# Patient Record
Sex: Female | Born: 1965 | Race: White | Hispanic: No | Marital: Married | Smoking: Never smoker
Health system: Southern US, Community
[De-identification: ages and names within clinical notes are randomized; demographics above are authoritative.]

## PROBLEM LIST (undated history)

## (undated) DIAGNOSIS — D219 Benign neoplasm of connective and other soft tissue, unspecified: Secondary | ICD-10-CM

## (undated) DIAGNOSIS — IMO0001 Reserved for inherently not codable concepts without codable children: Secondary | ICD-10-CM

## (undated) DIAGNOSIS — D649 Anemia, unspecified: Secondary | ICD-10-CM

## (undated) DIAGNOSIS — D229 Melanocytic nevi, unspecified: Secondary | ICD-10-CM

## (undated) DIAGNOSIS — Z803 Family history of malignant neoplasm of breast: Secondary | ICD-10-CM

## (undated) DIAGNOSIS — K635 Polyp of colon: Secondary | ICD-10-CM

## (undated) DIAGNOSIS — Z800 Family history of malignant neoplasm of digestive organs: Secondary | ICD-10-CM

## (undated) DIAGNOSIS — R7989 Other specified abnormal findings of blood chemistry: Secondary | ICD-10-CM

## (undated) DIAGNOSIS — N200 Calculus of kidney: Secondary | ICD-10-CM

## (undated) HISTORY — PX: TONSILLECTOMY AND ADENOIDECTOMY: SUR1326

## (undated) HISTORY — DX: Anemia, unspecified: D64.9

## (undated) HISTORY — DX: Calculus of kidney: N20.0

## (undated) HISTORY — DX: Family history of malignant neoplasm of breast: Z80.3

## (undated) HISTORY — DX: Melanocytic nevi, unspecified: D22.9

## (undated) HISTORY — PX: KNEE SURGERY: SHX244

## (undated) HISTORY — DX: Reserved for inherently not codable concepts without codable children: IMO0001

## (undated) HISTORY — DX: Family history of malignant neoplasm of digestive organs: Z80.0

## (undated) HISTORY — DX: Other specified abnormal findings of blood chemistry: R79.89

## (undated) HISTORY — DX: Polyp of colon: K63.5

## (undated) HISTORY — DX: Benign neoplasm of connective and other soft tissue, unspecified: D21.9

---
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
## 2001-07-03 ENCOUNTER — Other Ambulatory Visit: Admission: RE | Admit: 2001-07-03 | Discharge: 2001-07-03 | Payer: Self-pay | Admitting: Obstetrics and Gynecology

## 2002-07-09 ENCOUNTER — Other Ambulatory Visit: Admission: RE | Admit: 2002-07-09 | Discharge: 2002-07-09 | Payer: Self-pay | Admitting: Obstetrics and Gynecology

## 2003-07-05 ENCOUNTER — Inpatient Hospital Stay (HOSPITAL_COMMUNITY): Admission: AD | Admit: 2003-07-05 | Discharge: 2003-07-08 | Payer: Self-pay | Admitting: Obstetrics & Gynecology

## 2003-07-28 ENCOUNTER — Other Ambulatory Visit: Admission: RE | Admit: 2003-07-28 | Discharge: 2003-07-28 | Payer: Self-pay | Admitting: Obstetrics and Gynecology

## 2004-12-03 ENCOUNTER — Other Ambulatory Visit: Admission: RE | Admit: 2004-12-03 | Discharge: 2004-12-03 | Payer: Self-pay | Admitting: Obstetrics and Gynecology

## 2005-10-13 ENCOUNTER — Ambulatory Visit: Payer: Self-pay | Admitting: Internal Medicine

## 2005-10-20 ENCOUNTER — Ambulatory Visit: Payer: Self-pay | Admitting: Internal Medicine

## 2005-10-28 ENCOUNTER — Encounter: Admission: RE | Admit: 2005-10-28 | Discharge: 2005-10-28 | Payer: Self-pay | Admitting: Obstetrics and Gynecology

## 2006-03-21 ENCOUNTER — Other Ambulatory Visit: Admission: RE | Admit: 2006-03-21 | Discharge: 2006-03-21 | Payer: Self-pay | Admitting: Obstetrics and Gynecology

## 2006-03-31 ENCOUNTER — Ambulatory Visit: Payer: Self-pay | Admitting: Internal Medicine

## 2006-05-25 ENCOUNTER — Ambulatory Visit: Payer: Self-pay | Admitting: Internal Medicine

## 2006-08-22 DIAGNOSIS — D229 Melanocytic nevi, unspecified: Secondary | ICD-10-CM

## 2006-08-22 HISTORY — DX: Melanocytic nevi, unspecified: D22.9

## 2006-10-31 ENCOUNTER — Encounter: Admission: RE | Admit: 2006-10-31 | Discharge: 2006-10-31 | Payer: Self-pay | Admitting: Obstetrics and Gynecology

## 2007-04-27 ENCOUNTER — Ambulatory Visit: Payer: Self-pay | Admitting: Internal Medicine

## 2007-10-09 ENCOUNTER — Other Ambulatory Visit: Admission: RE | Admit: 2007-10-09 | Discharge: 2007-10-09 | Payer: Self-pay | Admitting: Obstetrics and Gynecology

## 2007-11-06 ENCOUNTER — Encounter: Admission: RE | Admit: 2007-11-06 | Discharge: 2007-11-06 | Payer: Self-pay | Admitting: Obstetrics and Gynecology

## 2008-06-09 DIAGNOSIS — N200 Calculus of kidney: Secondary | ICD-10-CM

## 2008-06-09 HISTORY — DX: Calculus of kidney: N20.0

## 2008-07-04 ENCOUNTER — Ambulatory Visit: Payer: Self-pay | Admitting: Diagnostic Radiology

## 2008-07-04 ENCOUNTER — Emergency Department (HOSPITAL_BASED_OUTPATIENT_CLINIC_OR_DEPARTMENT_OTHER): Admission: EM | Admit: 2008-07-04 | Discharge: 2008-07-04 | Payer: Self-pay | Admitting: *Deleted

## 2008-07-08 ENCOUNTER — Ambulatory Visit: Payer: Self-pay | Admitting: Internal Medicine

## 2008-07-08 DIAGNOSIS — N209 Urinary calculus, unspecified: Secondary | ICD-10-CM | POA: Insufficient documentation

## 2008-10-30 HISTORY — PX: ENDOMETRIAL BIOPSY: SHX622

## 2008-11-10 ENCOUNTER — Encounter: Admission: RE | Admit: 2008-11-10 | Discharge: 2008-11-10 | Payer: Self-pay | Admitting: Obstetrics and Gynecology

## 2008-11-24 HISTORY — PX: ABLATION: SHX5711

## 2009-11-12 ENCOUNTER — Encounter: Admission: RE | Admit: 2009-11-12 | Discharge: 2009-11-12 | Payer: Self-pay | Admitting: Obstetrics and Gynecology

## 2010-04-11 HISTORY — PX: HEMORRHOID SURGERY: SHX153

## 2010-07-22 LAB — DIFFERENTIAL
Basophils Absolute: 0 10*3/uL (ref 0.0–0.1)
Basophils Relative: 0 % (ref 0–1)
Eosinophils Absolute: 0 10*3/uL (ref 0.0–0.7)
Neutro Abs: 7.2 10*3/uL (ref 1.7–7.7)

## 2010-07-22 LAB — URINALYSIS, ROUTINE W REFLEX MICROSCOPIC
Bilirubin Urine: NEGATIVE
Leukocytes, UA: NEGATIVE
Nitrite: NEGATIVE
Protein, ur: 30 mg/dL — AB
Specific Gravity, Urine: 1.034 — ABNORMAL HIGH (ref 1.005–1.030)

## 2010-07-22 LAB — URINE MICROSCOPIC-ADD ON

## 2010-07-22 LAB — BASIC METABOLIC PANEL
GFR calc Af Amer: >60 mL/min (ref >60)
GFR calc non Af Amer: >60 mL/min (ref >60)

## 2010-07-22 LAB — CBC
RDW: 12.2 % (ref 11.5–15.5)
WBC: 7.4 10*3/uL (ref 4.0–10.5)

## 2010-08-27 NOTE — Op Note (Signed)
NAME:  Vanessa Schneider, Vanessa Schneider                         ACCOUNT NO.:  [id number]   MEDICAL RECORD NO.:  [id number]                   PATIENT TYPE:  INP   LOCATION:  9171                                 FACILITY:  [facility]   PHYSICIAN:  Gerrit Friends. Aldona Bar, M.D.                DATE OF BIRTH:  [birth year]   DATE OF PROCEDURE:  07/05/2003  DATE OF DISCHARGE:                                 OPERATIVE REPORT   PREOPERATIVE DIAGNOSES:  1. Term pregnancy.  2. Active labor.  3. Failure to progress.   POSTOPERATIVE DIAGNOSES:  1. Term pregnancy.  2. Active labor.  3. Failure to progress.  4. Delivery of an 8 pound 5 ounce female infant with Apgars of 9 and 9.   PROCEDURE:  Primary low transverse cesarean section.   SURGEON:  Gerrit Friends. Aldona Bar, M.D.   ANESTHESIA:  Epidural, Burnett Corrente, M.D.   HISTORY:  This 45 year old primigravida was admitted on the early morning of  March 26 in early labor.  She was 2 cm dilated at that time with the vertex  at -2 station, and the vertex did not fit the cervix well.   Around 11:30 a.m. she was 4, 80, and the vertex still at -2 station.  She  underwent amniotomy with production of a good amount of amniotic fluid, and  internal monitoring was employed.  She received, having requested, an  epidural and subsequently required Pitocin augmentation of her labor.  Despite three plus hours of Pitocin, at  approximately 3:30 p.m. her cervix  was still 4-5 cm but was now only 50-40% effaced and the vertex was at best  at -1 station.  There was some molding of the fetal vertex noted.   It was felt that the patient exhibited failure to progress with  dysfunctional labor and, accordingly, after discussion with the patient and  her husband she was taken to the operating room for primary low transverse  cesarean section for delivery.   OPERATIVE NOTE:  Once in the operating room the epidural was augmented.  A  Foley catheter was inserted prior to her arrival in the operating  room.  After the patient was adequately prepped and draped and good anesthetic  levels were documented, cesarean section was begun.  A Pfannenstiel incision  was made and with minimal difficulty dissected down sharply to and through  the fascia in a low transverse fashion.  Subfascial space was created  inferiorly and superiorly, muscle separated in the midline, peritoneum  identified and entered appropriately with care taken to avoid the bowel  superiorly and the bladder inferiorly.  At this time the vesicouterine  peritoneum was incised in a low transverse fashion and pushed off the lower  uterine segment with ease.  Sharp incision into the lower uterine segment  with the Metzenbaum scissors in the transverse fashion was then made and  extended laterally with fingers.  Amniotic fluid that  was noted was clear.  Thereafter, with minimal difficulty a viable female infant was delivered  from the vertex position.  The infant cried spontaneously at once and after  the cord was clamped and cut, the infant was passed off to the awaiting team  headed up by Dr. Alison Murray, and subsequently the infant was taken to the  nursery in good condition.  Apgars were noted to be 9 and 9, and subsequent  weight was found to be 8 pounds 5 ounces.   After cord bloods were collected, the placenta was delivered intact.  The  uterus was then exteriorized and rendered free of any remaining products of  conception.  Good uterine contractility was afforded with slowly-given  intravenous Pitocin and manual stimulation.  The uterine incision was closed  with #1 Vicryl in a running locking fashion, and this was reinforced with  several figure-of-eight #1 Vicryls.  Hemostasis was very adequate.  At this  time the abdomen was lavaged of all free blood and clot and the uterus was  replaced into the abdominal cavity.  Closure of the abdomen was begun at  this point after exploration of the abdomen found it to be free of any   remaining sponges or foreign bodies.  All counts were noted to be correct at  this point in time.   The abdominal peritoneum was closed with 0 Vicryl in a running fashion and  the muscle secured with same.  Assured of good subfascial hemostasis, the  fascia was then reapproximated using 0 Vicryl from angle to midline  bilaterally.  The subcutaneous tissue was rendered hemostatic and staples  were then used to close the skin.  A sterile pressure dressing applied and  the patient at this time was transported to recovery, having tolerated the  procedure well.  Estimated blood loss 500 mL.  All counts correct x2.   In summary, this primigravida presented in early labor and developed a very  dysfunctional labor pattern with failure to progress and was taken to the  operating room for a low transverse cesarean section with delivery of an 8  pound 5 ounce female infant with Apgars of 9 and 9.  At the conclusion of  the procedure both the mother and baby were doing well in their respective  recovery areas.                                               Gerrit Friends. Aldona Bar, M.D.    RMW/MEDQ  D:  07/05/2003  T:  07/07/2003  Job:  [id number]

## 2010-12-01 ENCOUNTER — Other Ambulatory Visit: Payer: Self-pay | Admitting: Obstetrics and Gynecology

## 2010-12-01 DIAGNOSIS — Z1231 Encounter for screening mammogram for malignant neoplasm of breast: Secondary | ICD-10-CM

## 2010-12-09 ENCOUNTER — Ambulatory Visit
Admission: RE | Admit: 2010-12-09 | Discharge: 2010-12-09 | Disposition: A | Discharge status: Home / Self Care | Payer: BC Managed Care – PPO | Source: Ambulatory Visit | Attending: Obstetrics and Gynecology | Admitting: Obstetrics and Gynecology

## 2010-12-09 DIAGNOSIS — Z1231 Encounter for screening mammogram for malignant neoplasm of breast: Secondary | ICD-10-CM

## 2010-12-16 ENCOUNTER — Other Ambulatory Visit: Payer: Self-pay | Admitting: Obstetrics and Gynecology

## 2010-12-16 DIAGNOSIS — R928 Other abnormal and inconclusive findings on diagnostic imaging of breast: Secondary | ICD-10-CM

## 2010-12-17 ENCOUNTER — Ambulatory Visit (INDEPENDENT_AMBULATORY_CARE_PROVIDER_SITE_OTHER): Payer: BC Managed Care – PPO | Admitting: General Surgery

## 2010-12-17 ENCOUNTER — Encounter (INDEPENDENT_AMBULATORY_CARE_PROVIDER_SITE_OTHER): Payer: Self-pay | Admitting: General Surgery

## 2010-12-17 VITALS — BP 106/72 | HR 62 | Temp 96.8°F | Ht 66.0 in | Wt 120.2 lb

## 2010-12-17 DIAGNOSIS — K648 Other hemorrhoids: Secondary | ICD-10-CM

## 2010-12-17 DIAGNOSIS — K644 Residual hemorrhoidal skin tags: Secondary | ICD-10-CM

## 2010-12-17 NOTE — Patient Instructions (Signed)
Please use one fleets enema per rectum the morning of surgery at least two hours prior to leaving the house. These can be purchased over the counter.

## 2010-12-17 NOTE — Progress Notes (Signed)
Chief Complaint  Patient presents with  . Other    Eval of hem tag ref by Dr. Genice Rouge    HPI Vanessa Schneider is a 45 y.o. female.   HPI 45 year old Caucasian female referred by Dr. Drue Novel for evaluation of the hemorrhoidal skin tag.The patient states that she developed this after her child was born in 2005. she feels self-conscious about it. She says it is hard to maintain her hygiene. She denies any pain with defecation. She reports one bowel movement per day. She denies any melena or hematochezia. She denies any incontinence. She drinks 2-3 glasses of water per day. She feels that she gets a fair amount of fiber in her diet. The tag has never been thrombosed. She does occasionally read while sitting on the commode.    Past Medical History  Diagnosis Date  . Nasal congestion   . Family history of breast cancer     mother (50) and cousin (64)  . Family history of colon cancer     Past Surgical History  Procedure Date  . Cesarean section 06/2003  . Tonsilectomy, adenoidectomy, bilateral myringotomy and tubes 1974  . Knee surgery 06/1992 and 08/1992    ACL tore bone and second surgery was to remove scar tissue. Left knee    Family History  Problem Relation Age of Onset  . Cancer Mother 65    breast    Social History History  Substance Use Topics  . Smoking status: Never Smoker   . Smokeless tobacco: Never Used  . Alcohol Use: Yes     2 or 3 on weekends    No Known Allergies  No current outpatient prescriptions on file.    Review of Systems Review of Systems  Constitutional: Negative for fever, chills, activity change and appetite change.  HENT: Positive for congestion. Negative for neck pain.   Eyes: Negative for photophobia and visual disturbance.  Respiratory: Negative for chest tightness, shortness of breath and wheezing.   Cardiovascular: Negative for chest pain.  Gastrointestinal: Negative for abdominal pain and abdominal distention.  Genitourinary: Negative for  dysuria, hematuria and difficulty urinating.  Musculoskeletal: Negative for back pain and arthralgias.  Neurological: Negative for dizziness and seizures.  Hematological: Negative.   Psychiatric/Behavioral: Negative.   All other systems reviewed and are negative.    Blood pressure 106/72, pulse 62, temperature 96.8 F (36 C), temperature source Temporal, height 5\' 6"  (1.676 m), weight 120 lb 3.2 oz (54.522 kg).  Physical Exam Physical Exam  Vitals reviewed. Constitutional: She is oriented to person, place, and time. She appears well-developed and well-nourished.  HENT:  Head: Normocephalic and atraumatic.  Eyes: Conjunctivae are normal. No scleral icterus.  Neck: Normal range of motion. Neck supple.  Cardiovascular: Normal rate, regular rhythm and normal heart sounds.   Pulmonary/Chest: Effort normal and breath sounds normal. No respiratory distress.  Abdominal: Soft. Bowel sounds are normal. She exhibits no distension.  Genitourinary: Rectal exam shows external hemorrhoid and internal hemorrhoid. Rectal exam shows no fissure, no tenderness and anal tone normal.       nonthrombosed redundant Left anterolateral ext hemorrhoid  2 column grade 1 internal hemorrhoids (rt lateral, left posterior) on anoscopy  Musculoskeletal: Normal range of motion.  Lymphadenopathy:    She has no cervical adenopathy.  Neurological: She is alert and oriented to person, place, and time. She exhibits normal muscle tone.  Skin: Skin is warm and dry.  Psychiatric: She has a normal mood and affect. Her behavior is normal.  Judgment and thought content normal.    Data Reviewed  Assessment    Grade 1 Two column internal hemorrhoids Left non-thrombosed external hemorrhoid    Plan    We discussed non-operative & operative management.  With respect to the external hemorrhoid,  I discussed observation vs surgical excision.  With respect to the internal hemorrhoids, we discussed observation vs hemorrhoidal  banding.  We discuss increasing her water & fiber intake. We discussed foods that are high in fiber.  I encouraged her to try to get 25-30 grams of fiber/day.  I also encouraged her not to sit on the commode for a prolonged period of time & not to read while on the commode.  The pt has elected to proceed with exam under anesthesia, excision of the external hemorrhoid, & banding of the internal hemorrhoids.  I discussed the procedure in detail.  The patient was given Agricultural engineer.  We discussed the risks and benefits of surgery including, but not limited to bleeding, infection, blood clot formation, anesthesia risk, urinary retention, hemorrhoid recurrence, injury to the sphincters resulting in incontinence, and the rare possibility of anal canal narrowing.  We discussed the typical postoperative course.  I stressed the importance of not becoming constipated after surgery.  The patient was encouraged to limit pain medication if possible as this increases the likelihood of becoming constipated. The patient was advised to take stool softners & drink 8-10 glasses of non-carbonated, non-alcoholic beverages per day and to eat a high fiber diet.  I also encouraged soaking in a water warm bath for 15 minutes at a time several times a day and after a bowel movement.  The patient was advised to take laxatives such as milk of magnesia or Miralax if no bowel movement three days after surgery.  The patient was advised to expect some blood tinged drainage as well as some blood in their bowel movements.      Atilano Ina, MD   [name] 12/17/2010, 11:22 AM

## 2010-12-21 ENCOUNTER — Ambulatory Visit
Admission: RE | Admit: 2010-12-21 | Discharge: 2010-12-21 | Disposition: A | Discharge status: Home / Self Care | Payer: BC Managed Care – PPO | Source: Ambulatory Visit | Attending: Obstetrics and Gynecology | Admitting: Obstetrics and Gynecology

## 2010-12-21 DIAGNOSIS — R928 Other abnormal and inconclusive findings on diagnostic imaging of breast: Secondary | ICD-10-CM

## 2011-01-14 ENCOUNTER — Encounter (HOSPITAL_COMMUNITY)
Admission: RE | Admit: 2011-01-14 | Discharge: 2011-01-14 | Disposition: A | Discharge status: Home / Self Care | Payer: BC Managed Care – PPO | Source: Ambulatory Visit | Attending: General Surgery | Admitting: General Surgery

## 2011-01-14 LAB — SURGICAL PCR SCREEN
MRSA, PCR: NEGATIVE
Staphylococcus aureus: NEGATIVE

## 2011-01-14 LAB — CBC
MCHC: 34.5 g/dL (ref 30.0–36.0)
MCV: 93.5 fL (ref 78.0–100.0)
RDW: 12.2 % (ref 11.5–15.5)
WBC: 8 10*3/uL (ref 4.0–10.5)

## 2011-01-14 LAB — HCG, SERUM, QUALITATIVE: Preg, Serum: NEGATIVE

## 2011-01-20 ENCOUNTER — Ambulatory Visit (HOSPITAL_COMMUNITY)
Admission: RE | Admit: 2011-01-20 | Discharge: 2011-01-20 | Disposition: A | Discharge status: Home / Self Care | Payer: BC Managed Care – PPO | Source: Ambulatory Visit | Attending: General Surgery | Admitting: General Surgery

## 2011-01-20 DIAGNOSIS — K644 Residual hemorrhoidal skin tags: Secondary | ICD-10-CM

## 2011-01-20 DIAGNOSIS — K648 Other hemorrhoids: Secondary | ICD-10-CM

## 2011-01-20 DIAGNOSIS — Z01812 Encounter for preprocedural laboratory examination: Secondary | ICD-10-CM | POA: Insufficient documentation

## 2011-01-20 NOTE — Op Note (Signed)
Vanessa Schneider, Vanessa Schneider               ACCOUNT NO.:  [id number]  MEDICAL RECORD NO.:  [id number]  LOCATION:  [facility]                         FACILITY:  [facility]  PHYSICIAN:  Mary Sella. Andrey Campanile, MD     DATE OF BIRTH:  [birth year]  DATE OF PROCEDURE:  01/20/2011 DATE OF DISCHARGE:                              OPERATIVE REPORT   PREOPERATIVE DIAGNOSES: 1. Nonthrombosed external hemorrhoids. 2. Internal hemorrhoids.  POSTOPERATIVE DIAGNOSES: 1. Left anterior nonthrombosed external hemorrhoid. 2. Two column grade 1 internal hemorrhoids.  PROCEDURES: 1. Exam under anesthesia 2. Excision of nonthrombosed left anterior external hemorrhoid 3. Banding of grade 1 internal hemorrhoids x2.  SURGEON:  Mary Sella. Andrey Campanile, MD  ASSISTANT:  None.  ANESTHESIA:  General plus 7 mL of 0.5% Marcaine with epi, then we used 20 mL at the conclusion of the procedure.  SPECIMEN LEFT:  Anterior external hemorrhoids, which was discarded.  ESTIMATED BLOOD LOSS:  Minimal.  INDICATIONS FOR PROCEDURE:  The patient is a very pleasant 45 year old female, who has had a long history of hemorrhoidal skin tag.  She says it is very difficult to maintain her hygiene with that and to keep the area clean.  It has never been thrombosed.  On physical exam in the office, we also noted some small grade 1 internal hemorrhoids.  We discussed the risks and benefits of excision and banding including but not limited to, bleeding, infection, injury to surrounding structures, urinary retention, pelvic sepsis, need for additional procedures, blood clot formation, and general anesthesia risks as well as the possibility of urinary retention.  The patient elects to proceed to surgery.  DESCRIPTION OF PROCEDURE:  After obtaining informed consent, the patient was brought to the operating room.  General endotracheal anesthesia was established.  Sequential compression devices were placed.  She was then placed in the prone jack-knife position on  the operating room table with the appropriate padding.  Her buttocks were taped apart in her perineum and anus was prepped with Betadine.  She received antibiotics prior to skin incision.  Surgical time-out was performed.  She had a nonthrombosed left external hemorrhoids in the left anterior position. I infiltrated 0.25% Marcaine with epi underneath this to minimize bleeding.  I then made an elliptical incision and excised it in its entirety.  I used the Bovie electrocautery briefly for hemostasis.  I then finger dilated her anus.  We then performed anoscopy.  There were no signs of fissure or fistula.  She had a grade 1 internal hemorrhoids in the left lateral position and she had a grade 1 internal hemorrhoid in the right posterolateral position.  I placed 2 rubber bands around the base of each of the internal hemorrhoids for a total of 4 bands used.  At this point, I closed the external hemorrhoid excision site with a running 3-0 Vicryl.  The length of the incision was about 1.5 cm. I left a small gap laterally for drainage.  I then injected Exparel in the regional fashion.  We then placed 4 x 4's, ABDs, and mesh underwear. She was then placed supine on the stretcher, extubated, an taken to the recovery in stable condition.  All needle, instruments, and sponge  counts were correct x2.  There are no immediate complications.  The patient tolerated the procedure well.     Mary Sella. Andrey Campanile, MD     EMW/MEDQ  D:  01/20/2011  T:  01/20/2011  Job:  [id number]  cc:   Willow Ora, MD Lionel December, M.D.  Electronically Signed by Gaynelle Adu M.D. on 01/20/2011 12:32:20 PM

## 2011-02-04 ENCOUNTER — Encounter (INDEPENDENT_AMBULATORY_CARE_PROVIDER_SITE_OTHER): Payer: Self-pay | Admitting: General Surgery

## 2011-02-04 ENCOUNTER — Ambulatory Visit (INDEPENDENT_AMBULATORY_CARE_PROVIDER_SITE_OTHER): Payer: BC Managed Care – PPO | Admitting: General Surgery

## 2011-02-04 VITALS — BP 108/70 | HR 73 | Temp 97.9°F | Ht 66.0 in | Wt 119.2 lb

## 2011-02-04 DIAGNOSIS — Z09 Encounter for follow-up examination after completed treatment for conditions other than malignant neoplasm: Secondary | ICD-10-CM

## 2011-02-04 NOTE — Patient Instructions (Signed)
Keep drinking plenty of fluids & eating a high fiber diet

## 2011-02-04 NOTE — Progress Notes (Signed)
Chief complaint: Postop  Procedure: Status post exam under anesthesia, excision of left anterior external hemorrhoid, hemorrhoidal banding x2 on January 20, 2011  History of Present Ilness: BP 108/70  Pulse 73  Temp 97.9 F (36.6 C)  Ht 5\' 6"  (1.676 m)  Wt 119 lb 3.2 oz (54.069 kg)  BMI 19.85 kg/m66  45 year old Caucasian female comes in for her first postoperative appointment. She states that she's been doing great. She states that she did not take any pain medication after surgery. She only took Tylenol. She states that the numbing medication lasted until Sunday. She took stool softeners. She had her first bowel movement 2 days after surgery. She has not had any constipation or pain with defecation. She denies any bleeding. She denies any nausea or vomiting. She denies any fevers or chills. She denies any dysuria  Physical Exam: Well-developed well-nourished Caucasian female in no apparent distress pulmonary-lungs are clear Rectal-no cellulitis, induration or fluctuance. There is a little bit of edema in the left and right anterior hemorrhoidal positions. Digital rectal exam was deferred   Assessment and Plan: Status post exam under anesthesia, excision of left anterior external hemorrhoid, hemorrhoidal banding x2-doing well  I am very pleased with how she did after surgery. I encouraged her to keep drinking plenty of water and to continue on a high fiber diet. I explained that the swelling should go down over the next several weeks.  I will see her in 6 weeks

## 2011-03-30 ENCOUNTER — Ambulatory Visit (INDEPENDENT_AMBULATORY_CARE_PROVIDER_SITE_OTHER): Payer: BC Managed Care – PPO | Admitting: General Surgery

## 2011-03-30 ENCOUNTER — Encounter (INDEPENDENT_AMBULATORY_CARE_PROVIDER_SITE_OTHER): Payer: Self-pay | Admitting: General Surgery

## 2011-03-30 VITALS — BP 112/68 | HR 68 | Temp 97.7°F | Resp 12 | Ht 66.0 in | Wt 120.2 lb

## 2011-03-30 DIAGNOSIS — Z09 Encounter for follow-up examination after completed treatment for conditions other than malignant neoplasm: Secondary | ICD-10-CM

## 2011-03-30 NOTE — Progress Notes (Signed)
Chief complaint: Postop  Procedure: Status post exam under anesthesia, excision of left anterior external hemorrhoid, hemorrhoidal banding x2 on January 20, 2011  History of Present Ilness: BP 112/68  Pulse 68  Temp(Src) 97.7 F (36.5 C) (Temporal)  Resp 12  Ht 5\' 6"  (1.676 m)  Wt 120 lb 3.2 oz (54.522 kg)  BMI 19.45 kg/m77  45 year old Caucasian female comes in for her second postoperative appointment. She states that she's been doing great. She is having daily bowel movements. She doesn't notice anything protruding from her anus after defecating. She has not had any constipation or pain with defecation. She denies any bleeding. She denies any nausea or vomiting. She denies any fevers or chills. She denies any dysuria  Physical Exam: BP 112/68  Pulse 68  Temp(Src) 97.7 F (36.5 C) (Temporal)  Resp 12  Ht 5\' 6"  (1.676 m)  Wt 120 lb 3.2 oz (54.522 kg)  BMI 19.40 kg/m2  Well-developed well-nourished Caucasian female in no apparent distress pulmonary-lungs are clear Rectal-no cellulitis, induration or fluctuance. Good tone. No palpable masses or fluctuance. Incision well healed.    Assessment and Plan: Status post exam under anesthesia, excision of left anterior external hemorrhoid, hemorrhoidal banding x2-doing well  I am very pleased with how she did after surgery. I encouraged her to keep drinking plenty of water and to continue on a high fiber diet.The swelling has resolved.   I will see her on a prn basis  Mary Sella. Andrey Campanile, MD, FACS General, Bariatric, & Minimally Invasive Surgery [facility], [place]

## 2011-03-30 NOTE — Patient Instructions (Signed)

## 2011-11-23 ENCOUNTER — Other Ambulatory Visit: Payer: Self-pay | Admitting: Obstetrics and Gynecology

## 2011-11-23 DIAGNOSIS — Z1231 Encounter for screening mammogram for malignant neoplasm of breast: Secondary | ICD-10-CM

## 2011-12-19 ENCOUNTER — Ambulatory Visit
Admission: RE | Admit: 2011-12-19 | Discharge: 2011-12-19 | Disposition: A | Discharge status: Home / Self Care | Payer: BC Managed Care – PPO | Source: Ambulatory Visit | Attending: Obstetrics and Gynecology | Admitting: Obstetrics and Gynecology

## 2011-12-19 DIAGNOSIS — Z1231 Encounter for screening mammogram for malignant neoplasm of breast: Secondary | ICD-10-CM

## 2012-03-20 ENCOUNTER — Ambulatory Visit (INDEPENDENT_AMBULATORY_CARE_PROVIDER_SITE_OTHER): Payer: BC Managed Care – PPO | Admitting: Internal Medicine

## 2012-03-20 ENCOUNTER — Encounter: Payer: Self-pay | Admitting: Internal Medicine

## 2012-03-20 VITALS — BP 106/72 | HR 77 | Temp 98.0°F | Wt 120.0 lb

## 2012-03-20 DIAGNOSIS — J069 Acute upper respiratory infection, unspecified: Secondary | ICD-10-CM

## 2012-03-20 MED ORDER — AMOXICILLIN 500 MG PO CAPS: 1000.0000 mg | ORAL_CAPSULE | Freq: Two times a day (BID) | ORAL | Status: DC

## 2012-03-20 NOTE — Progress Notes (Signed)
  Subjective:    Patient ID: Vanessa Schneider, female    DOB: September 28, 1965, 46 y.o.   MRN: [id number]  HPI Acute visit, last office visit 3 years ago 4 days history of face and chest congestion, cough with a very small amount of sputum. Taking Mucinex DM which helps. Some hoarseness, worse yesterday.  Past Medical History  Diagnosis Date  . Family history of breast cancer     mother (8) and cousin (59)  . Family history of colon cancer   . Contraception     husband's vasectomy   Past Surgical History  Procedure Date  . Cesarean section 06/2003  . Tonsilectomy, adenoidectomy, bilateral myringotomy and tubes 1974  . Knee surgery 06/1992 and 08/1992    ACL tore bone and second surgery was to remove scar tissue. Left knee  . Hemorrhoid surgery 2012   History   Social History  . Marital Status: Married    Spouse Name: N/A    Number of Children: 1  . Years of Education: N/A   Occupational History  . 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 Smoking status: Never Smoker   . Smokeless tobacco: Never Used  . Alcohol Use: Yes     Comment: 2 or 3 on weekends  . Drug Use: No  . Sexually Active: Not on file   Other Topics Concern  . Not on file   Social History Narrative  . No narrative on file   Family History  Problem Relation Age of Onset  . Breast cancer Mother 27  . Colon cancer      GM  . CAD Neg Hx   . Diabetes Neg Hx   . Mitral valve prolapse Mother      Review of Systems No fever chills, some achiness. No nausea, vomiting, diarrhea. Some anterior chest pain with cough only, denies shortness of breath or wheezing.     Objective:   Physical Exam General -- alert, well-developed, and well-nourished.   Neck --no thyromegaly , no LADs HEENT -- TMs normal, throat w/o redness, face symmetric and not tender to palpation, nose not congested  Lungs -- normal respiratory effort, no intercostal retractions, no accessory muscle use, and normal  breath sounds.   Heart-- normal rate, regular rhythm, no murmur, and no gallop.   Psych-- Cognition and judgment appear intact. Alert and cooperative with normal attention span and concentration.  not anxious appearing and not depressed appearing.        Assessment & Plan:  URI, see instructions

## 2012-03-20 NOTE — Patient Instructions (Addendum)
Rest, fluids , tylenol For cough, take Mucinex DM twice a day as needed  Take the antibiotic as prescribed  (Amoxicillin) if not improving in the next 2 or 3 days Call if no better in few days Call anytime if the symptoms are severe

## 2012-12-07 ENCOUNTER — Ambulatory Visit: Payer: Self-pay | Admitting: Obstetrics and Gynecology

## 2012-12-07 ENCOUNTER — Encounter: Payer: Self-pay | Admitting: Obstetrics and Gynecology

## 2012-12-07 ENCOUNTER — Other Ambulatory Visit: Payer: Self-pay

## 2012-12-07 ENCOUNTER — Ambulatory Visit (INDEPENDENT_AMBULATORY_CARE_PROVIDER_SITE_OTHER): Payer: BC Managed Care – PPO | Admitting: Obstetrics and Gynecology

## 2012-12-07 VITALS — BP 103/65 | HR 78 | Resp 20 | Ht 66.0 in | Wt 119.0 lb

## 2012-12-07 DIAGNOSIS — Z0000 Encounter for general adult medical examination without abnormal findings: Secondary | ICD-10-CM

## 2012-12-07 DIAGNOSIS — Z1231 Encounter for screening mammogram for malignant neoplasm of breast: Secondary | ICD-10-CM

## 2012-12-07 DIAGNOSIS — Z01419 Encounter for gynecological examination (general) (routine) without abnormal findings: Secondary | ICD-10-CM

## 2012-12-07 LAB — LIPID PANEL
Cholesterol: 157 mg/dL (ref 0–200)
LDL Cholesterol: 75 mg/dL (ref 0–99)
Total CHOL/HDL Ratio: 2.3 Ratio
Triglycerides: 68 mg/dL (ref <150)

## 2012-12-07 LAB — POCT URINALYSIS DIPSTICK
Bilirubin, UA: NEGATIVE
Blood, UA: NEGATIVE
Glucose, UA: NEGATIVE
Ketones, UA: NEGATIVE
Nitrite, UA: NEGATIVE
Urobilinogen, UA: NEGATIVE
pH, UA: 7

## 2012-12-07 LAB — HEMOGLOBIN, FINGERSTICK: Hemoglobin, fingerstick: 13.5 g/dL (ref 12.0–16.0)

## 2012-12-07 NOTE — Patient Instructions (Signed)
EXERCISE AND DIET:  We recommended that you start or continue a regular exercise program for good health. Regular exercise means any activity that makes your heart beat faster and makes you sweat.  We recommend exercising at least 30 minutes per day at least 3 days a week, preferably 4 or 5.  We also recommend a diet low in fat and sugar.  Inactivity, poor dietary choices and obesity can cause diabetes, heart attack, stroke, and kidney damage, among others.    ALCOHOL AND SMOKING:  Women should limit their alcohol intake to no more than 7 drinks/beers/glasses of wine (combined, not each!) per week. Moderation of alcohol intake to this level decreases your risk of breast cancer and liver damage. And of course, no recreational drugs are part of a healthy lifestyle.  And absolutely no smoking or even second hand smoke. Most people know smoking can cause heart and lung diseases, but did you know it also contributes to weakening of your bones? Aging of your skin?  Yellowing of your teeth and nails?  CALCIUM AND VITAMIN D:  Adequate intake of calcium and Vitamin D are recommended.  The recommendations for exact amounts of these supplements seem to change often, but generally speaking 600 mg of calcium (either carbonate or citrate) and 800 units of Vitamin D per day seems prudent. Certain women may benefit from higher intake of Vitamin D.  If you are among these women, your doctor will have told you during your visit.    PAP SMEARS:  Pap smears, to check for cervical cancer or precancers,  have traditionally been done yearly, although recent scientific advances have shown that most women can have pap smears less often.  However, every woman still should have a physical exam from her gynecologist every year. It will include a breast check, inspection of the vulva and vagina to check for abnormal growths or skin changes, a visual exam of the cervix, and then an exam to evaluate the size and shape of the uterus and  ovaries.  And after 47 years of age, a rectal exam is indicated to check for rectal cancers. We will also provide age appropriate advice regarding health maintenance, like when you should have certain vaccines, screening for sexually transmitted diseases, bone density testing, colonoscopy, mammograms, etc.   MAMMOGRAMS:  All women over 40 years old should have a yearly mammogram. Many facilities now offer a "3D" mammogram, which may cost around $50 extra out of pocket. If possible,  we recommend you accept the option to have the 3D mammogram performed.  It both reduces the number of women who will be called back for extra views which then turn out to be normal, and it is better than the routine mammogram at detecting truly abnormal areas.       

## 2012-12-07 NOTE — Progress Notes (Signed)
47 y.o.   Married    Caucasian   female   G1P1   here for annual exam.    Patient's last menstrual period was 11/05/2012.          Sexually active: yes  The current method of family planning is vasectomy, oral progesterone-only contraceptive and IUD.    Exercising: Treadmill, Weights 1-2 days a week Last mammogram:  12/19/11 neg Last pap smear:11/30/09 neg History of abnormal pap: no Smoking: no Alcohol: 2-3 glasses of wine a week Last colonoscopy:never Last Bone Density:never   Last tetanus shot: less than 10 years Last cholesterol check: 10/09/07 normal   Hgb:    13.5            Urine:neg   Family History  Problem Relation Age of Onset  . Breast cancer Mother 26  . Mitral valve prolapse Mother   . Dementia Mother   . Colon cancer      GM  . CAD Neg Hx   . Diabetes Neg Hx     Patient Active Problem List   Diagnosis Date Noted  . URINARY CALCULUS 07/08/2008    Past Medical History  Diagnosis Date  . Family history of breast cancer     mother (34) and cousin (49)  . Family history of colon cancer   . Contraception     husband's vasectomy  . Kidney stones 06/2008    spon passed  . Fibroid     Past Surgical History  Procedure Laterality Date  . Cesarean section  06/2003  . Tonsilectomy, adenoidectomy, bilateral myringotomy and tubes  1974  . Knee surgery  06/1992 and 08/1992    ACL tore bone and second surgery was to remove scar tissue. Left knee  . Hemorrhoid surgery  2012  . Ablation  11/24/08    Novasure    Allergies: Review of patient's allergies indicates no known allergies.  Current Outpatient Prescriptions  Medication Sig Dispense Refill  . Acetaminophen (TYLENOL PO) Take by mouth as needed.       No current facility-administered medications for this visit.    ROS: Pertinent items are noted in HPI.  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, broadcasting/film/video   Exam:    BP 103/65  Pulse 78  Resp 20  Ht 5\' 6"  (1.676 m)  Wt 119  lb (53.978 kg)  BMI 19.22 kg/m2  LMP 07/28/2014Ht and wt stable from last yr   Wt Readings from Last 3 Encounters:  12/07/12 119 lb (53.978 kg)  03/20/12 120 lb (54.432 kg)  03/30/11 120 lb 3.2 oz (54.522 kg)     Ht Readings from Last 3 Encounters:  12/07/12 5\' 6"  (1.676 m)  03/30/11 5\' 6"  (1.676 m)  02/04/11 5\' 6"  (1.676 m)    General appearance: alert, cooperative and appears stated age Head: Normocephalic, without obvious abnormality, atraumatic Neck: no adenopathy, supple, symmetrical, trachea midline and thyroid not enlarged, symmetric, no tenderness/mass/nodules Lungs: clear to auscultation bilaterally Breasts: Inspection negative, No nipple retraction or dimpling, No nipple discharge or bleeding, No axillary or supraclavicular adenopathy, Normal to palpation without dominant masses Heart: regular rate and rhythm Abdomen: soft, non-tender; bowel sounds normal; no masses,  no organomegaly Extremities: extremities normal, atraumatic, no cyanosis or edema Skin: Skin color, texture, turgor normal. No rashes or lesions Lymph nodes: Cervical, supraclavicular, and axillary nodes normal. No abnormal inguinal nodes palpated Neurologic: Grossly normal   Pelvic: External genitalia:  no lesions  Urethra:  normal appearing urethra with no masses, tenderness or lesions              Bartholins and Skenes: normal                 Vagina: normal appearing vagina with normal color and discharge, no lesions              Cervix: normal appearance              Pap taken: yes        Bimanual Exam:  Uterus:  uterus is normal size, shape, consistency and nontender                                      Adnexa: normal adnexa in size, nontender and no masses                                      Rectovaginal: Confirms                                      Anus:  normal sphincter tone, no lesions  A: normal gyn exam, vasectomy     Novasure 2010     P:     mammogram pap smear counseled  on breast self exam, mammography screening, adequate intake of calcium and vitamin D, diet and exercise return annually or prn     An After Visit Summary was printed and given to the patient.

## 2012-12-13 LAB — IPS PAP TEST WITH HPV

## 2012-12-21 ENCOUNTER — Ambulatory Visit: Payer: BC Managed Care – PPO

## 2013-01-29 ENCOUNTER — Ambulatory Visit: Payer: BC Managed Care – PPO

## 2013-02-26 ENCOUNTER — Ambulatory Visit
Admission: RE | Admit: 2013-02-26 | Discharge: 2013-02-26 | Disposition: A | Discharge status: Home / Self Care | Payer: BC Managed Care – PPO | Source: Ambulatory Visit

## 2013-02-26 DIAGNOSIS — Z1231 Encounter for screening mammogram for malignant neoplasm of breast: Secondary | ICD-10-CM

## 2013-12-06 ENCOUNTER — Telehealth: Payer: Self-pay | Admitting: Obstetrics and Gynecology

## 2013-12-06 NOTE — Telephone Encounter (Signed)
Confirming pts appt °

## 2013-12-12 ENCOUNTER — Ambulatory Visit (INDEPENDENT_AMBULATORY_CARE_PROVIDER_SITE_OTHER): Payer: BC Managed Care – PPO | Admitting: Obstetrics and Gynecology

## 2013-12-12 ENCOUNTER — Encounter: Payer: Self-pay | Admitting: Obstetrics and Gynecology

## 2013-12-12 VITALS — BP 100/62 | HR 64 | Resp 16 | Ht 66.0 in | Wt 119.0 lb

## 2013-12-12 DIAGNOSIS — N939 Abnormal uterine and vaginal bleeding, unspecified: Secondary | ICD-10-CM

## 2013-12-12 DIAGNOSIS — N898 Other specified noninflammatory disorders of vagina: Secondary | ICD-10-CM

## 2013-12-12 DIAGNOSIS — Z0000 Encounter for general adult medical examination without abnormal findings: Secondary | ICD-10-CM

## 2013-12-12 DIAGNOSIS — R6889 Other general symptoms and signs: Secondary | ICD-10-CM

## 2013-12-12 DIAGNOSIS — Z01419 Encounter for gynecological examination (general) (routine) without abnormal findings: Secondary | ICD-10-CM

## 2013-12-12 LAB — COMPREHENSIVE METABOLIC PANEL
ALBUMIN: 4.7 g/dL (ref 3.5–5.2)
ALT: 18 U/L (ref 0–35)
AST: 17 U/L (ref 0–37)
Alkaline Phosphatase: 44 U/L (ref 39–117)
BUN: 17 mg/dL (ref 6–23)
CALCIUM: 9.5 mg/dL (ref 8.4–10.5)
CO2: 27 meq/L (ref 19–32)
Chloride: 103 mEq/L (ref 96–112)
Creat: 0.71 mg/dL (ref 0.50–1.10)
GLUCOSE: 82 mg/dL (ref 70–99)
POTASSIUM: 4.2 meq/L (ref 3.5–5.3)
SODIUM: 138 meq/L (ref 135–145)
TOTAL PROTEIN: 7 g/dL (ref 6.0–8.3)
Total Bilirubin: 1.1 mg/dL (ref 0.2–1.2)

## 2013-12-12 LAB — POCT URINALYSIS DIPSTICK
Bilirubin, UA: NEGATIVE
Blood, UA: NEGATIVE
Glucose, UA: NEGATIVE
KETONES UA: NEGATIVE
LEUKOCYTES UA: NEGATIVE
Nitrite, UA: NEGATIVE
PROTEIN UA: NEGATIVE
Urobilinogen, UA: NEGATIVE
pH, UA: 5

## 2013-12-12 LAB — HEMOGLOBIN, FINGERSTICK: HEMOGLOBIN, FINGERSTICK: 14 g/dL (ref 12.0–16.0)

## 2013-12-12 LAB — CBC
HCT: 41.7 % (ref 36.0–46.0)
Hemoglobin: 13.9 g/dL (ref 12.0–15.0)
MCH: 31.2 pg (ref 26.0–34.0)
MCHC: 33.3 g/dL (ref 30.0–36.0)
MCV: 93.7 fL (ref 78.0–100.0)
PLATELETS: 205 10*3/uL (ref 150–400)
RBC: 4.45 MIL/uL (ref 3.87–5.11)
RDW: 13.1 % (ref 11.5–15.5)
WBC: 5.7 10*3/uL (ref 4.0–10.5)

## 2013-12-12 LAB — LIPID PANEL
Cholesterol: 165 mg/dL (ref 0–200)
HDL: 70 mg/dL (ref >39)
LDL Cholesterol: 82 mg/dL (ref 0–99)
Total CHOL/HDL Ratio: 2.4 Ratio
Triglycerides: 67 mg/dL (ref <150)
VLDL: 13 mg/dL (ref 0–40)

## 2013-12-12 LAB — TSH: TSH: 2.549 u[IU]/mL (ref 0.350–4.500)

## 2013-12-12 NOTE — Progress Notes (Signed)
Patient ID: Vanessa Schneider, female   DOB: 01/14/1966, 48 y.o.   MRN: [id number] GYNECOLOGY VISIT  PCP:   Kathlene November, MD  Referring provider:   HPI: 48 y.o.   Married  Caucasian  female   G1P1 with LMP 11/06/2011. here for  AEX. Had menses once every 6 months after endometrial ablation.  No bleeding for 2 years.  No hot flashes or night sweats.  Feels cold all the time.   Hgb:    14.0 Urine:  Neg  GYNECOLOGIC HISTORY: Patient's last menstrual period was 11/05/2012. Sexually active:  yes Partner preference: female Contraception:  Vasectomy/novasure   Menopausal hormone therapy:n/a DES exposure: no   Blood transfusions: no   Sexually transmitted diseases:  no  GYN procedures and prior surgeries: Novasure ablation 2010, C-section 06/2003, Endometrial biopsy--benign 2010.  Last mammogram: 02-26-13 dense breasts/wnl:[facility].                Last pap and high risk HPV testing:  12-07-12 wnl:neg HR HPV  History of abnormal pap smear:  no   OB History   Grav Para Term Preterm Abortions TAB SAB Ect Mult Living   1 1        1        LIFESTYLE: Exercise:   Walking on treadmill/and some weights             OTHER HEALTH MAINTENANCE: Tetanus/TDap:  2009 HPV:                 n/a Influenza:          never   Bone density:    n/a Colonoscopy:    never  Cholesterol check: 12-07-12  T:157, HDL:68, LDL:75, Trig:68  Family History  Problem Relation Age of Onset  . Breast cancer Mother 15  . Mitral valve prolapse Mother   . Dementia Mother   . Colon cancer      GM  . CAD Neg Hx   . Diabetes Neg Hx   . Colon cancer Paternal Grandmother     Patient Active Problem List   Diagnosis Date Noted  . URINARY CALCULUS 07/08/2008   Past Medical History  Diagnosis Date  . Family history of breast cancer     mother (38) and cousin (48)  . Family history of colon cancer   . Contraception     husband's vasectomy  . Kidney stones 06/2008    spon passed  . Fibroid     Past  Surgical History  Procedure Laterality Date  . Cesarean section  06/2003  . Tonsilectomy, adenoidectomy, bilateral myringotomy and tubes  1974  . Knee surgery  06/1992 and 08/1992    ACL tore bone and second surgery was to remove scar tissue. Left knee  . Hemorrhoid surgery  2012  . Ablation  11/24/08    Novasure  . Endometrial biopsy  10/30/08    benign    ALLERGIES: Review of patient's allergies indicates no known allergies.  Current Outpatient Prescriptions  Medication Sig Dispense Refill  . ibuprofen (ADVIL,MOTRIN) 200 MG tablet Take 200 mg by mouth every 6 (six) hours as needed.       No current facility-administered medications for this visit.     ROS:  Pertinent items are noted in HPI.  History   Social History  . Marital Status: Married    Spouse Name: N/A    Number of Children: 1  . Years of Education: N/A   Occupational History  .  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 Smoking status: Never Smoker   . Smokeless tobacco: Never Used  . Alcohol Use: 1.0 - 1.5 oz/week    2-3 drink(s) per week     Comment: 2-3 glasses of wine a week  . Drug Use: No  . Sexual Activity: Yes    Partners: Male    Birth Control/ Protection: Other-see comments     Comment: vasectomy/novasure ablation 11/2008   Other Topics Concern  . Not on file   Social History Narrative  . No narrative on file    PHYSICAL EXAMINATION:    BP 100/62  Pulse 64  Resp 16  Ht 5\' 6"  (1.676 m)  Wt 119 lb (53.978 kg)  BMI 19.22 kg/m2  LMP 11/05/2012   Wt Readings from Last 3 Encounters:  12/12/13 119 lb (53.978 kg)  12/07/12 119 lb (53.978 kg)  03/20/12 120 lb (54.432 kg)     Ht Readings from Last 3 Encounters:  12/12/13 5\' 6"  (1.676 m)  12/07/12 5\' 6"  (1.676 m)  03/30/11 5\' 6"  (1.676 m)    General appearance: alert, cooperative and appears stated age Head: Normocephalic, without obvious abnormality, atraumatic Neck: no adenopathy, supple, symmetrical, trachea  midline and thyroid not enlarged, symmetric, no tenderness/mass/nodules Lungs: clear to auscultation bilaterally Breasts: Inspection negative, No nipple retraction or dimpling, No nipple discharge or bleeding, No axillary or supraclavicular adenopathy, Normal to palpation without dominant masses Heart: regular rate and rhythm Abdomen: soft, non-tender; no masses,  no organomegaly Extremities: extremities normal, atraumatic, no cyanosis or edema Skin: Skin color, texture, turgor normal. No rashes or lesions Lymph nodes: Cervical, supraclavicular, and axillary nodes normal. No abnormal inguinal nodes palpated Neurologic: Grossly normal  Pelvic: External genitalia:  no lesions              Urethra:  normal appearing urethra with no masses, tenderness or lesions              Bartholins and Skenes: normal                 Vagina: normal appearing vagina with normal color and discharge, no lesions              Cervix: normal appearance, 5 mm white, almost ulcerative area near Nabothian cyst at 3 o'clock.  Small amount of dark brown blood noted.              Pap and high risk HPV testing done: Yes.          Bimanual Exam:  Uterus:  uterus is normal size, shape, consistency and nontender                                      Adnexa: normal adnexa in size, nontender and no masses                                      Rectovaginal:  Yes.                                        Confirms above.  Anus:  normal sphincter tone, no lesions  ASSESSMENT  Normal gynecologic exam. Possible vaginal ulcer.  Status post endometrial ablation.  No menses for 2 years.  PLAN  Mammogram recommended yearly starting at age 48. Pap smear and high risk HPV testing as above. Counseled on self breast exam, Calcium and vitamin D intake, exercise. See lab orders: Yes.  Routine labs plus FSH, estradiol. Return annually or prn   An After Visit Summary was printed and given to the  patient.

## 2013-12-12 NOTE — Patient Instructions (Signed)

## 2013-12-13 LAB — FOLLICLE STIMULATING HORMONE: FSH: 10.6 m[IU]/mL

## 2013-12-13 LAB — ESTRADIOL: ESTRADIOL: 107.3 pg/mL

## 2013-12-18 LAB — IPS PAP TEST WITH HPV

## 2014-02-10 ENCOUNTER — Encounter: Payer: Self-pay | Admitting: Obstetrics and Gynecology

## 2014-02-12 ENCOUNTER — Other Ambulatory Visit: Payer: Self-pay

## 2014-02-12 DIAGNOSIS — Z1231 Encounter for screening mammogram for malignant neoplasm of breast: Secondary | ICD-10-CM

## 2014-03-11 ENCOUNTER — Ambulatory Visit
Admission: RE | Admit: 2014-03-11 | Discharge: 2014-03-11 | Disposition: A | Discharge status: Home / Self Care | Payer: BC Managed Care – PPO | Source: Ambulatory Visit

## 2014-03-11 DIAGNOSIS — Z1231 Encounter for screening mammogram for malignant neoplasm of breast: Secondary | ICD-10-CM

## 2014-03-13 ENCOUNTER — Other Ambulatory Visit: Payer: Self-pay | Admitting: Obstetrics and Gynecology

## 2014-03-13 DIAGNOSIS — R928 Other abnormal and inconclusive findings on diagnostic imaging of breast: Secondary | ICD-10-CM

## 2014-03-24 ENCOUNTER — Ambulatory Visit
Admission: RE | Admit: 2014-03-24 | Discharge: 2014-03-24 | Disposition: A | Discharge status: Home / Self Care | Payer: BC Managed Care – PPO | Source: Ambulatory Visit | Attending: Obstetrics and Gynecology | Admitting: Obstetrics and Gynecology

## 2014-03-24 DIAGNOSIS — R928 Other abnormal and inconclusive findings on diagnostic imaging of breast: Secondary | ICD-10-CM

## 2014-12-22 ENCOUNTER — Ambulatory Visit: Payer: BC Managed Care – PPO | Admitting: Obstetrics and Gynecology

## 2014-12-22 ENCOUNTER — Encounter: Payer: Self-pay | Admitting: Obstetrics and Gynecology

## 2015-01-08 ENCOUNTER — Ambulatory Visit (INDEPENDENT_AMBULATORY_CARE_PROVIDER_SITE_OTHER): Payer: BLUE CROSS/BLUE SHIELD | Admitting: Obstetrics and Gynecology

## 2015-01-08 ENCOUNTER — Encounter: Payer: Self-pay | Admitting: Obstetrics and Gynecology

## 2015-01-08 VITALS — BP 110/74 | HR 70 | Resp 14 | Ht 66.0 in | Wt 122.4 lb

## 2015-01-08 DIAGNOSIS — Z0000 Encounter for general adult medical examination without abnormal findings: Secondary | ICD-10-CM | POA: Diagnosis not present

## 2015-01-08 DIAGNOSIS — Z01419 Encounter for gynecological examination (general) (routine) without abnormal findings: Secondary | ICD-10-CM | POA: Diagnosis not present

## 2015-01-08 LAB — CBC
HCT: 43.5 % (ref 36.0–46.0)
HEMOGLOBIN: 14.7 g/dL (ref 12.0–15.0)
MCH: 31.9 pg (ref 26.0–34.0)
MCHC: 33.8 g/dL (ref 30.0–36.0)
MCV: 94.4 fL (ref 78.0–100.0)
MPV: 10 fL (ref 8.6–12.4)
Platelets: 215 10*3/uL (ref 150–400)
RBC: 4.61 MIL/uL (ref 3.87–5.11)
RDW: 12.7 % (ref 11.5–15.5)
WBC: 7.5 10*3/uL (ref 4.0–10.5)

## 2015-01-08 LAB — LIPID PANEL
CHOL/HDL RATIO: 2.4 ratio (ref <=5.0)
Cholesterol: 178 mg/dL (ref 125–200)
HDL: 75 mg/dL (ref >=46)
LDL CALC: 89 mg/dL (ref <130)
Triglycerides: 70 mg/dL (ref <150)
VLDL: 14 mg/dL (ref <30)

## 2015-01-08 LAB — COMPREHENSIVE METABOLIC PANEL
ALT: 19 U/L (ref 6–29)
AST: 18 U/L (ref 10–35)
Albumin: 5 g/dL (ref 3.6–5.1)
Alkaline Phosphatase: 46 U/L (ref 33–115)
BUN: 15 mg/dL (ref 7–25)
CO2: 24 mmol/L (ref 20–31)
Calcium: 10 mg/dL (ref 8.6–10.2)
Chloride: 102 mmol/L (ref 98–110)
Creat: 0.74 mg/dL (ref 0.50–1.10)
Glucose, Bld: 85 mg/dL (ref 65–99)
Potassium: 3.9 mmol/L (ref 3.5–5.3)
Sodium: 139 mmol/L (ref 135–146)
TOTAL PROTEIN: 7.7 g/dL (ref 6.1–8.1)
Total Bilirubin: 1.1 mg/dL (ref 0.2–1.2)

## 2015-01-08 LAB — POCT URINALYSIS DIPSTICK
Bilirubin, UA: NEGATIVE
Blood, UA: NEGATIVE
GLUCOSE UA: NEGATIVE
KETONES UA: NEGATIVE
LEUKOCYTES UA: NEGATIVE
Nitrite, UA: NEGATIVE
Protein, UA: NEGATIVE
Urobilinogen, UA: NEGATIVE
pH, UA: 5

## 2015-01-08 LAB — TSH: TSH: 2.582 u[IU]/mL (ref 0.350–4.500)

## 2015-01-08 LAB — HEMOGLOBIN, FINGERSTICK: Hemoglobin, fingerstick: 14.9 g/dL (ref 12.0–16.0)

## 2015-01-08 NOTE — Patient Instructions (Signed)

## 2015-01-08 NOTE — Progress Notes (Signed)
Patient ID: Vanessa Schneider, female   DOB: 05-26-1965, 49 y.o.   MRN: [id number] 49 y.o. G1P1 Married Caucasian female here for annual exam.    Stress at work.  Feeling a shaking in the back of her throat.  Would like to see a Social worker.   FSH 10.6 last year.  No hot flashes or night sweats.  No bleeding.   Mother has dementia.  PCP:  Kathlene November, MD   No LMP recorded. Patient has had an ablation.          Sexually active: Yes.  female partner  The current method of family planning is vasectomy/novasure.    Exercising: No.   Smoker:  no  Health Maintenance: Pap:  12-12-13 Neg:Neg HR HPV History of abnormal Pap:  no MMG:  03-11-14 Density Cat.C/possible Right breast mass,Left negative;Ultrasound revealed benign Right breast cyst--Rec. 1 year screening:[facility]. Colonoscopy:  n/a BMD:   n/a  Result  n/a TDaP:  2009 Screening Labs:  Hb today: 14.9, Urine today: Neg   reports that she has never smoked. She has never used smokeless tobacco. She reports that she drinks about 1.2 - 1.8 oz of alcohol per week. She reports that she does not use illicit drugs.  Past Medical History  Diagnosis Date  . Family history of breast cancer     mother (58) and cousin (39)  . Family history of colon cancer   . Contraception     husband's vasectomy  . Kidney stones 06/2008    spon passed  . Fibroid     Past Surgical History  Procedure Laterality Date  . Cesarean section  06/2003  . Tonsilectomy, adenoidectomy, bilateral myringotomy and tubes  1974  . Knee surgery  06/1992 and 08/1992    ACL tore bone and second surgery was to remove scar tissue. Left knee  . Hemorrhoid surgery  2012  . Ablation  11/24/08    Novasure  . Endometrial biopsy  10/30/08    benign    Current Outpatient Prescriptions  Medication Sig Dispense Refill  . ibuprofen (ADVIL,MOTRIN) 200 MG tablet Take 200 mg by mouth every 6 (six) hours as needed.     No current facility-administered medications for this visit.     Family History  Problem Relation Age of Onset  . Breast cancer Mother 50  . Mitral valve prolapse Mother   . Dementia Mother   . Colon cancer      GM  . CAD Neg Hx   . Diabetes Neg Hx   . Colon cancer Paternal Grandmother     ROS:  Pertinent items are noted in HPI.  Otherwise, a comprehensive ROS was negative.  Exam:   BP 110/74 mmHg  Pulse 70  Resp 14  Ht 5\' 6"  (1.676 m)  Wt 122 lb 6.4 oz (55.52 kg)  BMI 19.77 kg/m2    General appearance: alert, cooperative and appears stated age Head: Normocephalic, without obvious abnormality, atraumatic Neck: no adenopathy, supple, symmetrical, trachea midline and thyroid normal to inspection and palpation Lungs: clear to auscultation bilaterally Breasts: normal appearance, no masses or tenderness, Inspection negative, No nipple retraction or dimpling, No nipple discharge or bleeding, No axillary or supraclavicular adenopathy Heart: regular rate and rhythm Abdomen: soft, non-tender; bowel sounds normal; no masses,  no organomegaly Extremities: extremities normal, atraumatic, no cyanosis or edema Skin: Skin color, texture, turgor normal. No rashes or lesions Lymph nodes: Cervical, supraclavicular, and axillary nodes normal. No abnormal inguinal nodes palpated Neurologic: Grossly  normal  Pelvic: External genitalia:  no lesions              Urethra:  normal appearing urethra with no masses, tenderness or lesions              Bartholins and Skenes: normal                 Vagina: normal appearing vagina with normal color and discharge, no lesions              Cervix: 4 mm flat white area at 6:00 on cervix.              Pap taken: Yes.   Bimanual Exam:  Uterus:  normal size, contour, position, consistency, mobility, non-tender              Adnexa: normal adnexa and no mass, fullness, tenderness              Rectovaginal: Yes.  .  Confirms.              Anus:  normal sphincter tone, no lesions  Chaperone was present for  exam.  Assessment:   Well woman visit with normal exam. Status post endometrial ablation.  Nonspecific area on the cervix.  FH of breast cancer.  FH of dementia.  Situational stress.  Plan: Yearly mammogram recommended after age 2. Patient will schedule at [facility]. Recommended self breast exam.  Pap and HR HPV as above. Discussed Calcium, Vitamin D, regular exercise program including cardiovascular and weight bearing exercise. Labs performed.  Yes.  .   See orders.  Routine labs. Refills given on medications.  No..    Discussed increased exercise for stress relief.  Also gave name of counselor, Marya Amsler, for stress reduction and life coaching.  Follow up annually and prn.     After visit summary provided.

## 2015-01-12 LAB — IPS PAP TEST WITH HPV

## 2015-04-12 DIAGNOSIS — R7989 Other specified abnormal findings of blood chemistry: Secondary | ICD-10-CM

## 2015-04-12 HISTORY — DX: Other specified abnormal findings of blood chemistry: R79.89

## 2015-04-17 ENCOUNTER — Ambulatory Visit: Payer: Self-pay | Admitting: Obstetrics and Gynecology

## 2015-04-22 ENCOUNTER — Other Ambulatory Visit: Payer: Self-pay

## 2015-04-22 DIAGNOSIS — Z1231 Encounter for screening mammogram for malignant neoplasm of breast: Secondary | ICD-10-CM

## 2015-05-05 ENCOUNTER — Ambulatory Visit
Admission: RE | Admit: 2015-05-05 | Discharge: 2015-05-05 | Disposition: A | Discharge status: Home / Self Care | Payer: BLUE CROSS/BLUE SHIELD | Source: Ambulatory Visit

## 2015-05-05 DIAGNOSIS — Z1231 Encounter for screening mammogram for malignant neoplasm of breast: Secondary | ICD-10-CM

## 2016-01-20 ENCOUNTER — Encounter: Payer: Self-pay | Admitting: Obstetrics and Gynecology

## 2016-01-20 ENCOUNTER — Ambulatory Visit: Payer: BLUE CROSS/BLUE SHIELD | Admitting: Obstetrics and Gynecology

## 2016-01-20 VITALS — BP 100/60 | HR 76 | Resp 14 | Ht 66.5 in | Wt 123.0 lb

## 2016-01-20 DIAGNOSIS — Z0000 Encounter for general adult medical examination without abnormal findings: Secondary | ICD-10-CM

## 2016-01-20 DIAGNOSIS — Z01411 Encounter for gynecological examination (general) (routine) with abnormal findings: Secondary | ICD-10-CM | POA: Diagnosis not present

## 2016-01-20 DIAGNOSIS — N951 Menopausal and female climacteric states: Secondary | ICD-10-CM

## 2016-01-20 LAB — COMPREHENSIVE METABOLIC PANEL
ALBUMIN: 5.1 g/dL (ref 3.6–5.1)
ALT: 31 U/L — ABNORMAL HIGH (ref 6–29)
AST: 25 U/L (ref 10–35)
Alkaline Phosphatase: 50 U/L (ref 33–130)
BUN: 17 mg/dL (ref 7–25)
CHLORIDE: 101 mmol/L (ref 98–110)
CO2: 28 mmol/L (ref 20–31)
Calcium: 10.1 mg/dL (ref 8.6–10.4)
Creat: 1.33 mg/dL — ABNORMAL HIGH (ref 0.50–1.05)
Glucose, Bld: 64 mg/dL — ABNORMAL LOW (ref 65–99)
POTASSIUM: 4.1 mmol/L (ref 3.5–5.3)
Sodium: 143 mmol/L (ref 135–146)
TOTAL PROTEIN: 7.6 g/dL (ref 6.1–8.1)
Total Bilirubin: 0.8 mg/dL (ref 0.2–1.2)

## 2016-01-20 LAB — CBC
HEMATOCRIT: 43 % (ref 35.0–45.0)
HEMOGLOBIN: 14.3 g/dL (ref 11.7–15.5)
MCH: 31.7 pg (ref 27.0–33.0)
MCHC: 33.3 g/dL (ref 32.0–36.0)
MCV: 95.3 fL (ref 80.0–100.0)
MPV: 10.1 fL (ref 7.5–12.5)
Platelets: 214 10*3/uL (ref 140–400)
RBC: 4.51 MIL/uL (ref 3.80–5.10)
RDW: 12.9 % (ref 11.0–15.0)
WBC: 6.5 10*3/uL (ref 3.8–10.8)

## 2016-01-20 LAB — POCT URINALYSIS DIPSTICK
Bilirubin, UA: NEGATIVE
GLUCOSE UA: NEGATIVE
KETONES UA: NEGATIVE
Leukocytes, UA: NEGATIVE
Nitrite, UA: NEGATIVE
Protein, UA: NEGATIVE
Urobilinogen, UA: NEGATIVE
pH, UA: 7

## 2016-01-20 LAB — LIPID PANEL
Cholesterol: 196 mg/dL (ref 125–200)
HDL: 93 mg/dL (ref >=46)
LDL Cholesterol: 89 mg/dL (ref <130)
Total CHOL/HDL Ratio: 2.1 Ratio (ref <=5.0)
Triglycerides: 68 mg/dL (ref <150)
VLDL: 14 mg/dL (ref <30)

## 2016-01-20 LAB — TSH: TSH: 3.12 m[IU]/L

## 2016-01-20 NOTE — Patient Instructions (Signed)
EXERCISE AND DIET:  We recommended that you start or continue a regular exercise program for good health. Regular exercise means any activity that makes your heart beat faster and makes you sweat.  We recommend exercising at least 30 minutes per day at least 3 days a week, preferably 4 or 5.  We also recommend a diet low in fat and sugar.  Inactivity, poor dietary choices and obesity can cause diabetes, heart attack, stroke, and kidney damage, among others.    ALCOHOL AND SMOKING:  Women should limit their alcohol intake to no more than 7 drinks/beers/glasses of wine (combined, not each!) per week. Moderation of alcohol intake to this level decreases your risk of breast cancer and liver damage. And of course, no recreational drugs are part of a healthy lifestyle.  And absolutely no smoking or even second hand smoke. Most people know smoking can cause heart and lung diseases, but did you know it also contributes to weakening of your bones? Aging of your skin?  Yellowing of your teeth and nails?  CALCIUM AND VITAMIN D:  Adequate intake of calcium and Vitamin D are recommended.  The recommendations for exact amounts of these supplements seem to change often, but generally speaking 600 mg of calcium (either carbonate or citrate) and 800 units of Vitamin D per day seems prudent. Certain women may benefit from higher intake of Vitamin D.  If you are among these women, your doctor will have told you during your visit.    PAP SMEARS:  Pap smears, to check for cervical cancer or precancers,  have traditionally been done yearly, although recent scientific advances have shown that most women can have pap smears less often.  However, every woman still should have a physical exam from her gynecologist every year. It will include a breast check, inspection of the vulva and vagina to check for abnormal growths or skin changes, a visual exam of the cervix, and then an exam to evaluate the size and shape of the uterus and  ovaries.  And after 50 years of age, a rectal exam is indicated to check for rectal cancers. We will also provide age appropriate advice regarding health maintenance, like when you should have certain vaccines, screening for sexually transmitted diseases, bone density testing, colonoscopy, mammograms, etc.   MAMMOGRAMS:  All women over 40 years old should have a yearly mammogram. Many facilities now offer a "3D" mammogram, which may cost around $50 extra out of pocket. If possible,  we recommend you accept the option to have the 3D mammogram performed.  It both reduces the number of women who will be called back for extra views which then turn out to be normal, and it is better than the routine mammogram at detecting truly abnormal areas.    COLONOSCOPY:  Colonoscopy to screen for colon cancer is recommended for all women at age 50.  We know, you hate the idea of the prep.  We agree, BUT, having colon cancer and not knowing it is worse!!  Colon cancer so often starts as a polyp that can be seen and removed at colonscopy, which can quite literally save your life!  And if your first colonoscopy is normal and you have no family history of colon cancer, most women don't have to have it again for 10 years.  Once every ten years, you can do something that may end up saving your life, right?  We will be happy to help you get it scheduled when you are ready.    Be sure to check your insurance coverage so you understand how much it will cost.  It may be covered as a preventative service at no cost, but you should check your particular policy.    Menopause and Herbal Products WHAT IS MENOPAUSE? Menopause is the normal time of life when menstrual periods decrease in frequency and eventually stop completely. This process can take several years for some women. Menopause is complete when you have had an absence of menstruation for a full year since your last menstrual period. It usually occurs between the ages of 21 and 46.  It is not common for menopause to begin before the age of 41. During menopause, your body stops producing the female hormones estrogen and progesterone. Common symptoms associated with this loss of hormones (vasomotor symptoms) are:  Hot flashes.  Hot flushes.  Night sweats. Other common symptoms and complications of menopause include:  Decrease in sex drive.  Vaginal dryness and thinning of the walls of the vagina. This can make sex painful.  Dryness of the skin and development of wrinkles.  Headaches.  Tiredness.  Irritability.  Memory problems.  Weight gain.  Bladder infections.  Hair growth on the face and chest.  Inability to reproduce offspring (infertility).  Loss of density in the bones (osteoporosis) increasing your risk for breaks (fractures).  Depression.  Hardening and narrowing of the arteries (atherosclerosis). This increases your risk of heart attack and stroke. WHAT TREATMENT OPTIONS ARE AVAILABLE? There are many treatment choices for menopause symptoms. The most common treatment is hormone replacement therapy. Many alternative therapies for menopause are emerging, including the use of herbal products. These supplements can be found in the form of herbs, teas, oils, tinctures, and pills. Common herbal supplements for menopause are made from plants that contain phytoestrogens. Phytoestrogens are compounds that occur naturally in plants and plant products. They act like estrogen in the body. Foods and herbs that contain phytoestrogens include:  Soy.  Flax seeds.  Red clover.  Ginseng. WHAT MENOPAUSE SYMPTOMS MAY BE HELPED IF I USE HERBAL PRODUCTS?  Vasomotor symptoms. These may be helped by:  Soy. Some studies show that soy may have a moderate benefit for hot flashes.  Black cohosh. There is limited evidence indicating this may be beneficial for hot flashes.  Symptoms that are related to heart and blood vessel disease. These may be helped by soy.  Studies have shown that soy can help to lower cholesterol.  Depression. This may be helped by:  St. John's wort. There is limited evidence that shows this may help mild to moderate depression.  Black cohosh. There is evidence that this may help depression and mood swings.  Osteoporosis. Soy may help to decrease bone loss that is associated with menopause and may prevent osteoporosis. Limited evidence indicates that red clover may offer some bone loss protection as well. Other herbal products that are commonly used during menopause lack enough evidence to support their use as a replacement for conventional menopause therapies. These products include evening primrose, ginseng, and red clover. WHAT ARE THE CASES WHEN HERBAL PRODUCTS SHOULD NOT BE USED DURING MENOPAUSE? Do not use herbal products during menopause without your health care provider's approval if:  You are taking medicine.  You have a preexisting liver condition. ARE THERE ANY RISKS IN MY TAKING HERBAL PRODUCTS DURING MENOPAUSE? If you choose to use herbal products to help with symptoms of menopause, keep in mind that:  Different supplements have different and unmeasured amounts of herbal ingredients.  Herbal products are not regulated the same way that medicines are.  Concentrations of herbs may vary depending on the way they are prepared. For example, the concentration may be different in a pill, tea, oil, and tincture.  Little is known about the risks of using herbal products, particularly the risks of long-term use.  Some herbal supplements can be harmful when combined with certain medicines. Most commonly reported side effects of herbal products are mild. However, if used improperly, many herbal supplements can cause serious problems. Talk to your health care provider before starting any herbal product. If problems develop, stop taking the supplement and let your health care provider know.   This information is not  intended to replace advice given to you by your health care provider. Make sure you discuss any questions you have with your health care provider.   Document Released: 09/14/2007 Document Revised: 04/18/2014 Document Reviewed: 09/10/2013 Elsevier Interactive Patient Education [employer].

## 2016-01-20 NOTE — Progress Notes (Signed)
50 y.o. G1P1 Married Caucasian female here for annual exam.    Having hot flashes.  The come and go.  Not unbearable.  Declines tx.  Leaks urine with sneeze and exercise.  No pad use.  Up twice a night to void.  No caffeine use.   Just celebrated her 50th birthday in [place].  Mother doing well and living at home.  Her parents live 20 minutes away.   PCP:  Kathlene November   Patient's last menstrual period was 04/11/2008 (approximate).           Sexually active: Yes.    The current method of family planing is ablation.  Exercising: Yes.    Walking 2-3 x weekly Smoker:  no  Health Maintenance: Pap:  01/08/15 Neg. HR HPV:neg  History of abnormal Pap:  No MMG:  05/05/15 BIRADS1:Neg  Colonoscopy:  n/a BMD:   n/a TDaP:  2009 HIV: in pregnancy.  Hep C: not applicable. Screening Labs:  Hb today: 14.5, Urine today: RBC=Trace.   No symptoms.    reports that she has never smoked. She has never used smokeless tobacco. She reports that she drinks about 1.2 - 1.8 oz of alcohol per week . She reports that she does not use drugs.  Past Medical History:  Diagnosis Date  . Contraception    husband's vasectomy  . Family history of breast cancer    mother (9) and cousin (6)  . Family history of colon cancer   . Fibroid   . Kidney stones 06/2008   spon passed    Past Surgical History:  Procedure Laterality Date  . ABLATION  11/24/08   Novasure  . CESAREAN SECTION  06/2003  . ENDOMETRIAL BIOPSY  10/30/08   benign  . [place]  2012  . KNEE SURGERY  06/1992 and 08/1992   ACL tore bone and second surgery was to remove scar tissue. Left knee  . TONSILECTOMY, ADENOIDECTOMY, BILATERAL MYRINGOTOMY AND TUBES  1974    Current Outpatient Prescriptions  Medication Sig Dispense Refill  . ibuprofen (ADVIL,MOTRIN) 200 MG tablet Take 200 mg by mouth every 6 (six) hours as needed.     No current facility-administered medications for this visit.     Family History  Problem Relation Age of  Onset  . Breast cancer Mother 60  . Mitral valve prolapse Mother   . Dementia Mother   . Colon cancer Paternal Grandmother   . Colon cancer      GM  . CAD Neg Hx   . Diabetes Neg Hx     ROS:  Pertinent items are noted in HPI.  Otherwise, a comprehensive ROS was negative.  Exam:   BP 100/60 (BP Location: Right Arm, Patient Position: Sitting, Cuff Size: Normal)   Pulse 76   Resp 14   Ht 5' 6.5" (1.689 m)   Wt 123 lb (55.8 kg)   LMP 04/11/2008 (Approximate)   BMI 19.56 kg/m     General appearance: alert, cooperative and appears stated age Head: Normocephalic, without obvious abnormality, atraumatic Neck: no adenopathy, supple, symmetrical, trachea midline and thyroid normal to inspection and palpation Lungs: clear to auscultation bilaterally Breasts: normal appearance, no masses or tenderness, No nipple retraction or dimpling, No nipple discharge or bleeding, No axillary or supraclavicular adenopathy Heart: regular rate and rhythm Abdomen: soft, non-tender; no masses, no organomegaly Extremities: extremities normal, atraumatic, no cyanosis or edema Skin: Skin color, texture, turgor normal. No rashes or lesions Lymph nodes: Cervical, supraclavicular, and axillary nodes  normal. No abnormal inguinal nodes palpated Neurologic: Grossly normal  Pelvic: External genitalia:  no lesions              Urethra:  normal appearing urethra with no masses, tenderness or lesions              Bartholins and Skenes: normal                 Vagina: normal appearing vagina with normal color and discharge, no lesions              Cervix:  Whitish area at 5:00.                Pap taken: Yes.   Bimanual Exam:  Uterus:  normal size, contour, position, consistency, mobility, non-tender              Adnexa: no mass, fullness, tenderness              Rectal exam: Yes.  .  Confirms.              Anus:  normal sphincter tone, no lesions  Chaperone was present for exam.  Assessment:   Well woman visit  with normal exam. Mild menopausal symptoms. Status post endometrial ablation.  Nonspecific area on the cervix.  GSI.  FH of breast cancer.  FH of dementia.     Plan: Yearly mammogram recommended after age 73.  Recommended self breast exam.  Pap and HR HPV as above. Discussed Calcium, Vitamin D, regular exercise program including cardiovascular and weight bearing exercise. Routine labs.  Information about herbal tx for menopausal symptoms.  Kegels reviewed.  Call if incontinence worsens.   Follow up annually and prn.      After visit summary provided.

## 2016-01-21 ENCOUNTER — Encounter: Payer: Self-pay | Admitting: Obstetrics and Gynecology

## 2016-01-21 LAB — VITAMIN D 25 HYDROXY (VIT D DEFICIENCY, FRACTURES): Vit D, 25-Hydroxy: 36 ng/mL (ref 30–100)

## 2016-01-22 ENCOUNTER — Telehealth: Payer: Self-pay | Admitting: *Deleted

## 2016-01-22 LAB — IPS PAP TEST WITH HPV

## 2016-01-22 NOTE — Telephone Encounter (Signed)
Message left to return call to Triage Nurse at [phone] to review lab results.

## 2016-01-22 NOTE — Telephone Encounter (Signed)
-----   Message from Nunzio Cobbs, MD sent at 01/21/2016  8:57 PM EDT ----- Please share results of blood work with the patient.   Her metabolic profile did show some alterations in her liver and kidney tests.  The creatinine was slightly elevated above the normal range = 1.33.  Normal is 0.5 - 1.05.  Her level last year was 0.74. Her glucose level was one point below normal, which is not of concern.  Her liver enzyme called ALT was 2 points above normal.  I am recommending she follow up with her primary care provider for a recheck of her kidney function and the other minor chemistry abnormalities. The creatine will need follow up.  The testing was normal for her thyroid, blood counts, cholesterol, and vitamin D.  Her pap is pending.

## 2016-01-27 ENCOUNTER — Telehealth: Payer: Self-pay | Admitting: Internal Medicine

## 2016-01-27 NOTE — Telephone Encounter (Signed)
Patient scheduled for 02/01/2016. Thank you

## 2016-01-27 NOTE — Telephone Encounter (Signed)
Spoke with patient. Advised of all results as seen below from [place]. Patient is agreeable and verbalizes understanding. Patient will contact Dr.Paz to schedule a follow up appointment to be seen for elevated ALT and creatinine level. Dr.Paz is with [facility] and will be able to see lab results. 2 month repeat pap scheduled for 03/30/2016 at 3:30 pm with Dr.Silva. Patient is agreeable to date and time.  Notes Recorded by Nunzio Cobbs, MD on 01/22/2016 at 1:11 PM EDT Please inform patient that her pap has returned showing inadequate cells.  The lab states there is an interfering substance.  The high risk HPV test is negative.   I recommend a pap in 2 months.  She had an area of her cervix which was an ill-defined white area. Please schedule the appointment with me.  Routing to provider for final review. Patient agreeable to disposition. Will close encounter.

## 2016-01-27 NOTE — Telephone Encounter (Signed)
Relation to [id number] Call back number:[phone]   Reason for call:  Patient would like to reestablish with Dr. Larose Kells please advise

## 2016-01-27 NOTE — Telephone Encounter (Signed)
Schedule an appointment at her earliest convenience. Okay to put two 15-minute appointments together.

## 2016-01-29 ENCOUNTER — Telehealth: Payer: Self-pay | Admitting: *Deleted

## 2016-01-29 NOTE — Telephone Encounter (Signed)
Unable to reach patient at time of Pre Visit Call.   

## 2016-02-01 ENCOUNTER — Encounter: Payer: Self-pay | Admitting: Internal Medicine

## 2016-02-01 ENCOUNTER — Ambulatory Visit (INDEPENDENT_AMBULATORY_CARE_PROVIDER_SITE_OTHER): Payer: BLUE CROSS/BLUE SHIELD | Admitting: Internal Medicine

## 2016-02-01 VITALS — BP 112/74 | HR 74 | Temp 98.4°F | Resp 14 | Ht 67.0 in | Wt 124.1 lb

## 2016-02-01 DIAGNOSIS — Z0000 Encounter for general adult medical examination without abnormal findings: Secondary | ICD-10-CM | POA: Diagnosis not present

## 2016-02-01 DIAGNOSIS — Z1211 Encounter for screening for malignant neoplasm of colon: Secondary | ICD-10-CM

## 2016-02-01 DIAGNOSIS — R7989 Other specified abnormal findings of blood chemistry: Secondary | ICD-10-CM

## 2016-02-01 DIAGNOSIS — Z114 Encounter for screening for human immunodeficiency virus [HIV]: Secondary | ICD-10-CM

## 2016-02-01 LAB — HIV ANTIBODY (ROUTINE TESTING W REFLEX): HIV 1&2 Ab, 4th Generation: NONREACTIVE

## 2016-02-01 NOTE — Progress Notes (Signed)
Subjective:    Patient ID: Vanessa Schneider, female    DOB: 1966-04-01, 50 y.o.   MRN: [id number]  DOS:  02/01/2016 Type of visit - description : new pt, CPX Interval history:  In general feeling well, no major concern except her recent lab results.   Review of Systems Constitutional: No fever. No chills. No unexplained wt changes. No unusual sweats  HEENT: No dental problems, no ear discharge, no facial swelling, no voice changes. No eye discharge, no eye  redness , no  intolerance to light   Respiratory: No wheezing , no  difficulty breathing. No cough , no mucus production  Cardiovascular: No CP, no leg swelling , no  Palpitations  GI: no nausea, no vomiting, no diarrhea , no  abdominal pain.  No blood in the stools. No dysphagia, no odynophagia    Endocrine: No polyphagia, no polyuria , no polydipsia  GU: No dysuria, gross hematuria, difficulty urinating. No urinary urgency, no frequency.  Musculoskeletal: No joint swellings or unusual aches or pains  Skin: No change in the color of the skin, palor , no  Rash  Allergic, immunologic: No environmental allergies , no  food allergies  Neurological: No dizziness no  syncope. No headaches. No diplopia, no slurred, no slurred speech, no motor deficits, no facial  Numbness  Hematological: No enlarged lymph nodes, no easy bruising , no unusual bleedings  Psychiatry: No suicidal ideas, no hallucinations, no beavior problems, no confusion.  No unusual/severe anxiety, no depression    Past Medical History:  Diagnosis Date  . Contraception    husband's vasectomy  . Elevated serum creatinine 2017  . Family history of breast cancer    mother (47) and cousin (34)  . Family history of colon cancer   . Fibroid   . Kidney stones 06/2008   spon passed    Past Surgical History:  Procedure Laterality Date  . ABLATION  11/24/08   Novasure  . CESAREAN SECTION  06/2003  . ENDOMETRIAL BIOPSY  10/30/08   benign  . [place]  2012  . KNEE SURGERY  06/1992 and 08/1992   ACL tore bone and second surgery was to remove scar tissue. Left knee  . TONSILECTOMY, ADENOIDECTOMY, BILATERAL MYRINGOTOMY AND TUBES  1974    Social History   Social History  . Marital status: Married    Spouse name: N/A  . Number of children: 1  . Years of education: N/A   Occupational History  . retirement office  [place] History Main Topics  . Smoking status: Never Smoker  . Smokeless tobacco: Never Used  . Alcohol use 1.2 - 1.8 oz/week    2 - 3 Standard drinks or equivalent per week     Comment: 2-3 glasses of wine a week  . Drug use: No  . Sexual activity: Yes    Partners: Male    Birth control/ protection: Other-see comments     Comment: vasectomy/novasure ablation 11/2008   Other Topics Concern  . Not on file   Social History Narrative  . No narrative on file     Family History  Problem Relation Age of Onset  . Breast cancer Mother 60  . Mitral valve prolapse Mother   . Dementia Mother   . Colon cancer Paternal Grandmother   . CAD Neg Hx   . Diabetes Neg Hx        Medication List    as of 02/01/2016 11:59 PM  You have not been prescribed any medications.        Objective:   Physical Exam BP 112/74 (BP Location: Right Arm, Patient Position: Sitting, Cuff Size: Normal)   Pulse 74   Temp 98.4 F (36.9 C) (Oral)   Resp 14   Ht 5\' 7"  (1.702 m)   Wt 124 lb 2 oz (56.3 kg)   LMP 04/11/2008 (Approximate)   SpO2 97%   BMI 19.44 kg/m   General:   Well developed, well nourished . NAD.  Neck: No  thyromegaly  HEENT:  Normocephalic . Face symmetric, atraumatic Lungs:  CTA B Normal respiratory effort, no intercostal retractions, no accessory muscle use. Heart: RRR,  no murmur.  No pretibial edema bilaterally  Abdomen:  Not distended, soft, non-tender. No rebound or rigidity.   Skin: Exposed areas without rash. Not pale. Not jaundice Neurologic:  alert & oriented X3.    Speech normal, gait appropriate for age and unassisted Strength symmetric and appropriate for age.  Psych: Cognition and judgment appear intact.  Cooperative with normal attention span and concentration.  Behavior appropriate. No anxious or depressed appearing.    Assessment & Plan:   Assessment Kidney stones, 2010  Plan: Elevated creatinine: Last creatinine was elevated to 1.3, at the time, the patient was feeling well, she takes occasionally 1 ibuprofen a day but not every day (since then has completely stop). She was not dehydrated. She doesn't take any other medications. Denies edema, no hypertension or anemia Plan: Repeat BMP, renal ultrasound, further advise with results. RTC  1  year

## 2016-02-01 NOTE — Patient Instructions (Signed)
GO TO THE LAB : Get the blood work     GO TO THE FRONT DESK Schedule your next appointment for a  physical exam in one year  

## 2016-02-01 NOTE — Assessment & Plan Note (Signed)
Td < 10 years per pt, will reasses next year Declined flu shot CCS: modalities discussed, elected cscope, refer to [facility]  Female care per Dr Quincy Simmonds Diet-exercise discussed  Check a HIV

## 2016-02-01 NOTE — Progress Notes (Signed)
Pre visit review using our clinic review tool, if applicable. No additional management support is needed unless otherwise documented below in the visit note. 

## 2016-02-02 ENCOUNTER — Encounter: Payer: Self-pay | Admitting: Gastroenterology

## 2016-02-02 DIAGNOSIS — Z09 Encounter for follow-up examination after completed treatment for conditions other than malignant neoplasm: Secondary | ICD-10-CM | POA: Insufficient documentation

## 2016-02-02 LAB — BASIC METABOLIC PANEL
BUN: 23 mg/dL (ref 6–23)
CO2: 30 meq/L (ref 19–32)
Calcium: 10 mg/dL (ref 8.4–10.5)
Chloride: 102 mEq/L (ref 96–112)
Creatinine, Ser: 0.93 mg/dL (ref 0.40–1.20)
GFR: 67.74 mL/min (ref >60.00)
GLUCOSE: 94 mg/dL (ref 70–99)
Potassium: 4.1 mEq/L (ref 3.5–5.1)
Sodium: 139 mEq/L (ref 135–145)

## 2016-02-02 NOTE — Assessment & Plan Note (Signed)
Elevated creatinine: Last creatinine was elevated to 1.3, at the time, the patient was feeling well, she takes occasionally 1 ibuprofen a day but not every day (since then has completely stop). She was not dehydrated. She doesn't take any other medications. Denies edema, no hypertension or anemia Plan: Repeat BMP, renal ultrasound, further advise with results. RTC  1  year

## 2016-02-08 ENCOUNTER — Encounter: Payer: Self-pay | Admitting: Obstetrics and Gynecology

## 2016-02-08 ENCOUNTER — Ambulatory Visit (HOSPITAL_BASED_OUTPATIENT_CLINIC_OR_DEPARTMENT_OTHER)
Admission: RE | Admit: 2016-02-08 | Discharge: 2016-02-08 | Disposition: A | Discharge status: Home / Self Care | Payer: BLUE CROSS/BLUE SHIELD | Source: Ambulatory Visit | Attending: Internal Medicine | Admitting: Internal Medicine

## 2016-02-08 DIAGNOSIS — R7989 Other specified abnormal findings of blood chemistry: Secondary | ICD-10-CM | POA: Diagnosis present

## 2016-02-08 DIAGNOSIS — D219 Benign neoplasm of connective and other soft tissue, unspecified: Secondary | ICD-10-CM

## 2016-02-08 HISTORY — DX: Benign neoplasm of connective and other soft tissue, unspecified: D21.9

## 2016-03-30 ENCOUNTER — Ambulatory Visit (INDEPENDENT_AMBULATORY_CARE_PROVIDER_SITE_OTHER): Payer: BLUE CROSS/BLUE SHIELD | Admitting: Obstetrics and Gynecology

## 2016-03-30 ENCOUNTER — Ambulatory Visit (AMBULATORY_SURGERY_CENTER): Payer: Self-pay | Admitting: *Deleted

## 2016-03-30 VITALS — BP 110/60 | HR 76 | Resp 14 | Ht 66.0 in | Wt 126.0 lb

## 2016-03-30 VITALS — Ht 66.0 in | Wt 126.0 lb

## 2016-03-30 DIAGNOSIS — R87615 Unsatisfactory cytologic smear of cervix: Secondary | ICD-10-CM

## 2016-03-30 DIAGNOSIS — N898 Other specified noninflammatory disorders of vagina: Secondary | ICD-10-CM

## 2016-03-30 DIAGNOSIS — Z1211 Encounter for screening for malignant neoplasm of colon: Secondary | ICD-10-CM

## 2016-03-30 MED ORDER — NA SULFATE-K SULFATE-MG SULF 17.5-3.13-1.6 GM/177ML PO SOLN: ORAL | 0 refills | Status: DC

## 2016-03-30 NOTE — Progress Notes (Signed)
Patient denies any allergies to eggs or soy. Patient denies any problems with anesthesia/sedation. Patient denies any oxygen use at home and does not take any diet/weight loss medications. EMMI education assisgned to patient on colonoscopy, this was explained and instructions given to patient. 

## 2016-03-30 NOTE — Progress Notes (Signed)
GYNECOLOGY  VISIT   HPI: 50 y.o.   Married  Caucasian  female   G1P1 with No LMP recorded. Patient has had an ablation.   here for 2 month repeat pap. Pap on 01/20/16 had inadequate cells.     HR HPV was negative.   No recent use of any vaginal creams or gels.  Having vaginal dryness.   GYNECOLOGIC HISTORY: No LMP recorded. Patient has had an ablation.  LMP 04/11/08 - approximate. Contraception:  Vasectomy/ablation Menopausal hormone therapy:  none Last mammogram:  1/17 category c density birads 1:neg Last pap smear:   01/20/16 inadequate cells        OB History    Gravida Para Term Preterm AB Living   1 1       1    SAB TAB Ectopic Multiple Live Births                     Patient Active Problem List   Diagnosis Date Noted  . PCP NOTES >>>>>>>>>>>>>>> 02/02/2016  . Annual physical exam 02/01/2016  . URINARY CALCULUS 07/08/2008    Past Medical History:  Diagnosis Date  . Contraception    husband's vasectomy  . Elevated serum creatinine 2017  . Family history of breast cancer    mother (26) and cousin (54)  . Family history of colon cancer   . Fibroid 02/08/2016   2 cm   . Kidney stones 06/2008   spon passed    Past Surgical History:  Procedure Laterality Date  . ABLATION  11/24/08   Novasure  . CESAREAN SECTION  06/2003  . ENDOMETRIAL BIOPSY  10/30/08   benign  . [place]  2012  . KNEE SURGERY  06/1992 and 08/1992   ACL tore bone and second surgery was to remove scar tissue. Left knee  . TONSILECTOMY, ADENOIDECTOMY, BILATERAL MYRINGOTOMY AND TUBES  1974    Current Outpatient Prescriptions  Medication Sig Dispense Refill  . Na Sulfate-K Sulfate-Mg Sulf 17.5-3.13-1.6 GM/180ML SOLN Suprep (no substitutions)-TAKE AS DIRECTED. 354 mL 0   No current facility-administered medications for this visit.      ALLERGIES: Patient has no known allergies.  Family History  Problem Relation Age of Onset  . Breast cancer Mother 55  . Mitral valve prolapse Mother    . Dementia Mother   . Colon cancer Paternal Grandmother     36's  . CAD Neg Hx   . Diabetes Neg Hx     Social History   Social History  . Marital status: Married    Spouse name: N/A  . Number of children: 1  . 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 Smoking status: Never Smoker  . Smokeless tobacco: Never Used  . Alcohol use 1.2 - 1.8 oz/week    2 - 3 Standard drinks or equivalent per week     Comment: 2-3 glasses of wine a week  . Drug use: No  . Sexual activity: Yes    Partners: Male    Birth control/ protection: Other-see comments     Comment: vasectomy/novasure ablation 11/2008   Other Topics Concern  . Not on file   Social History Narrative  . No narrative on file    ROS:  Pertinent items are noted in HPI.  PHYSICAL EXAMINATION:    BP 110/60 (BP Location: Right Arm, Patient Position: Sitting)   Pulse 76  Resp 14   Ht 5\' 6"  (1.676 m)   Wt 126 lb (57.2 kg)   BMI 20.34 kg/m     General appearance: alert, cooperative and appears stated age    Pelvic: External genitalia:  no lesions.  Nabothian cyst at 8 - 9:00 - 4 mm.               Urethra:  normal appearing urethra with no masses, tenderness or lesions              Bartholins and Skenes: normal                 Vagina: normal appearing vagina with normal color and discharge, no lesions              Cervix: no lesions.  Pap collected.                Bimanual Exam:  Uterus:  normal size, contour, position, consistency, mobility, non-tender              Adnexa: no mass, fullness, tenderness            Chaperone was present for exam.  ASSESSMENT   Insufficient pap. Vaginal dryness.  PLAN  Pap and HR HPV collected.  Discussed water based lubricants, cooking oils, and vaginal estrogen prescriptions for vaginal dryness.  No Rx given today for vaginal estrogen.  She will call back if desires.  I did discuss potential risk of  breast cancer with usage. Mammogram scheduled for January 2018.  I would get this done first if desires to use the vaginal estrogen.    An After Visit Summary was printed and given to the patient.  ___15___ minutes face to face time of which over 50% was spent in counseling.

## 2016-03-31 ENCOUNTER — Encounter: Payer: Self-pay | Admitting: Obstetrics and Gynecology

## 2016-04-06 LAB — IPS PAP TEST WITH HPV

## 2016-04-18 ENCOUNTER — Encounter: Payer: Self-pay | Admitting: Gastroenterology

## 2016-04-18 ENCOUNTER — Encounter: Payer: BLUE CROSS/BLUE SHIELD | Admitting: Gastroenterology

## 2016-04-18 ENCOUNTER — Ambulatory Visit (AMBULATORY_SURGERY_CENTER): Payer: BLUE CROSS/BLUE SHIELD | Admitting: Gastroenterology

## 2016-04-18 VITALS — BP 105/52 | HR 63 | Temp 97.1°F | Resp 11 | Ht 66.0 in | Wt 126.0 lb

## 2016-04-18 DIAGNOSIS — Z1212 Encounter for screening for malignant neoplasm of rectum: Secondary | ICD-10-CM

## 2016-04-18 DIAGNOSIS — Z1211 Encounter for screening for malignant neoplasm of colon: Secondary | ICD-10-CM | POA: Diagnosis not present

## 2016-04-18 MED ORDER — SODIUM CHLORIDE 0.9 % IV SOLN: 500.0000 mL | INTRAVENOUS | Status: DC

## 2016-04-18 NOTE — Progress Notes (Signed)
Report to PACU, RN, vss, BBS= Clear.  

## 2016-04-18 NOTE — Patient Instructions (Signed)
YOU HAD AN ENDOSCOPIC PROCEDURE TODAY AT [facility]:   Refer to the procedure report that was given to you for any specific questions about what was found during the examination.  If the procedure report does not answer your questions, please call your gastroenterologist to clarify.  If you requested that your care partner not be given the details of your procedure findings, then the procedure report has been included in a sealed envelope for you to review at your convenience later.  YOU SHOULD EXPECT: Some feelings of bloating in the abdomen. Passage of more gas than usual.  Walking can help get rid of the air that was put into your GI tract during the procedure and reduce the bloating. If you had a lower endoscopy (such as a colonoscopy or flexible sigmoidoscopy) you may notice spotting of blood in your stool or on the toilet paper. If you underwent a bowel prep for your procedure, you may not have a normal bowel movement for a few days.  Please Note:  You might notice some irritation and congestion in your nose or some drainage.  This is from the oxygen used during your procedure.  There is no need for concern and it should clear up in a day or so.  SYMPTOMS TO REPORT IMMEDIATELY:   Following lower endoscopy (colonoscopy or flexible sigmoidoscopy):  Excessive amounts of blood in the stool  Significant tenderness or worsening of abdominal pains  Swelling of the abdomen that is new, acute  Fever of 100F or higher  For urgent or emergent issues, a gastroenterologist can be reached at any hour by calling [phone].   DIET:  We do recommend a small meal at first, but then you may proceed to your regular diet.  Drink plenty of fluids but you should avoid alcoholic beverages for 24 hours.  ACTIVITY:  You should plan to take it easy for the rest of today and you should NOT DRIVE or use heavy machinery until tomorrow (because of the sedation medicines used during the test).     FOLLOW UP: Our staff will call the number listed on your records the next business day following your procedure to check on you and address any questions or concerns that you may have regarding the information given to you following your procedure. If we do not reach you, we will leave a message.  However, if you are feeling well and you are not experiencing any problems, there is no need to return our call.  We will assume that you have returned to your regular daily activities without incident.  If any biopsies were taken you will be contacted by phone or by letter within the next 1-3 weeks.  Please call us at [phone] if you have not heard about the biopsies in 3 weeks.    Diverticulosis (handout given)  SIGNATURES/CONFIDENTIALITY: You and/or your care partner have signed paperwork which will be entered into your electronic medical record.  These signatures attest to the fact that that the information above on your After Visit Summary has been reviewed and is understood.  Full responsibility of the confidentiality of this discharge information lies with you and/or your care-partner.

## 2016-04-18 NOTE — Op Note (Signed)
[facility] Patient Name: Vanessa Schneider Procedure Date: 04/18/2016 10:29 AM MRN: [id number] Endoscopist: Remo Lipps P. Armbruster MD, MD Age: 51 Referring MD:  Date of Birth: Jun 26, 1965 Gender: Female Account #: [id number] Procedure:                Colonoscopy Indications:              Screening for colorectal malignant neoplasm, This                            is the patient's first colonoscopy Medicines:                Monitored Anesthesia Care Procedure:                Pre-Anesthesia Assessment:                           - Prior to the procedure, a History and Physical                            was performed, and patient medications and                            allergies were reviewed. The patient's tolerance of                            previous anesthesia was also reviewed. The risks                            and benefits of the procedure and the sedation                            options and risks were discussed with the patient.                            All questions were answered, and informed consent                            was obtained. Prior Anticoagulants: The patient has                            taken no previous anticoagulant or antiplatelet                            agents. ASA Grade Assessment: II - A patient with                            mild systemic disease. After reviewing the risks                            and benefits, the patient was deemed in                            satisfactory condition to undergo the procedure.  After obtaining informed consent, the colonoscope                            was passed under direct vision. Throughout the                            procedure, the patient's blood pressure, pulse, and                            oxygen saturations were monitored continuously. The                            Model PCF-[id number]) scope was introduced                            through the anus  and advanced to the the cecum,                            identified by appendiceal orifice and ileocecal                            valve. The colonoscopy was performed without                            difficulty. The patient tolerated the procedure                            well. The quality of the bowel preparation was                            good. The ileocecal valve, appendiceal orifice, and                            rectum were photographed. Scope In: 10:36:07 AM Scope Out: 10:51:57 AM Scope Withdrawal Time: 0 hours 10 minutes 28 seconds  Total Procedure Duration: 0 hours 15 minutes 50 seconds  Findings:                 The perianal and digital rectal examinations were                            normal.                           Scattered medium-mouthed diverticula were found in                            the entire colon.                           Anal papilla(e) were hypertrophied on retroflexion.                           The exam was otherwise without abnormality on  direct and retroflexion views. No polyps. Complications:            No immediate complications. Estimated blood loss:                            None. Estimated Blood Loss:     Estimated blood loss: none. Impression:               - Diverticulosis in the entire examined colon.                           - Anal papilla(e) were hypertrophied.                           - The examination was otherwise normal on direct                            and retroflexion views.                           - No polyps. Recommendation:           - Patient has a contact number available for                            emergencies. The signs and symptoms of potential                            delayed complications were discussed with the                            patient. Return to normal activities tomorrow.                            Written discharge instructions were provided to the                             patient.                           - Resume previous diet.                           - Continue present medications.                           - Repeat colonoscopy in 10 years for screening                            purposes. Remo Lipps P. Armbruster MD, MD 04/18/2016 10:55:43 AM This report has been signed electronically.

## 2016-04-19 ENCOUNTER — Telehealth: Payer: Self-pay

## 2016-04-19 NOTE — Telephone Encounter (Signed)
  Follow up Call-  Call back number 04/18/2016  Post procedure Call Back phone  # [phone]  Permission to leave phone message Yes  Some recent data might be hidden    Patient was called for follow up after her procedure on 04/18/2016. No answer at the number given for follow up phone call. A message was left on the answering machine.

## 2016-04-20 ENCOUNTER — Other Ambulatory Visit: Payer: Self-pay | Admitting: Obstetrics and Gynecology

## 2016-04-20 DIAGNOSIS — Z1231 Encounter for screening mammogram for malignant neoplasm of breast: Secondary | ICD-10-CM

## 2016-05-12 ENCOUNTER — Ambulatory Visit
Admission: RE | Admit: 2016-05-12 | Discharge: 2016-05-12 | Disposition: A | Discharge status: Home / Self Care | Payer: BLUE CROSS/BLUE SHIELD | Source: Ambulatory Visit | Attending: Obstetrics and Gynecology | Admitting: Obstetrics and Gynecology

## 2016-05-12 DIAGNOSIS — Z1231 Encounter for screening mammogram for malignant neoplasm of breast: Secondary | ICD-10-CM

## 2016-11-14 ENCOUNTER — Encounter: Payer: Self-pay | Admitting: Internal Medicine

## 2016-11-14 ENCOUNTER — Ambulatory Visit (INDEPENDENT_AMBULATORY_CARE_PROVIDER_SITE_OTHER): Payer: BLUE CROSS/BLUE SHIELD | Admitting: Internal Medicine

## 2016-11-14 VITALS — BP 116/66 | HR 66 | Temp 97.9°F | Resp 14 | Ht 66.0 in | Wt 125.2 lb

## 2016-11-14 DIAGNOSIS — R102 Pelvic and perineal pain: Secondary | ICD-10-CM

## 2016-11-14 DIAGNOSIS — M549 Dorsalgia, unspecified: Secondary | ICD-10-CM

## 2016-11-14 LAB — POC URINALSYSI DIPSTICK (AUTOMATED)
Bilirubin, UA: NEGATIVE
Glucose, UA: NEGATIVE
Ketones, UA: NEGATIVE
LEUKOCYTES UA: NEGATIVE
Nitrite, UA: NEGATIVE
PH UA: 6 (ref 5.0–8.0)
PROTEIN UA: NEGATIVE
SPEC GRAV UA: 1.015 (ref 1.010–1.025)
UROBILINOGEN UA: 0.2 U/dL

## 2016-11-14 NOTE — Progress Notes (Signed)
Pre visit review using our clinic review tool, if applicable. No additional management support is needed unless otherwise documented below in the visit note. 

## 2016-11-14 NOTE — Patient Instructions (Signed)
Please drink plenty of water  Okay to take Tylenol as needed  Call if not back to normal in few days.  Call if he has persistent pain, fever, chills, blood in the urine, nausea vomiting.

## 2016-11-14 NOTE — Progress Notes (Signed)
Subjective:    Patient ID: Vanessa Schneider, female    DOB: 08-Jan-1966, 51 y.o.   MRN: [id number]  DOS:  11/14/2016 Type of visit - description : acute Interval history: Symptoms started a week ago with pain in the right lower back, at times intense, decreases with Tylenol. started to drink lots of water thinking she had a kidney stone, symptoms actually improve and back pain become mild. Yesterday, developed pelvic pain bilaterally, urinary frequency. Symptoms today slightly better but not completely gone.   Review of Systems No fever chills No nausea, vomiting, diarrhea No vaginal discharge or bleeding No actual dysuria or gross hematuria  Past Medical History:  Diagnosis Date  . Contraception    husband's vasectomy  . Elevated serum creatinine 2017  . Family history of breast cancer    mother (26) and cousin (67)  . Family history of colon cancer   . Fibroid 02/08/2016   2 cm   . Kidney stones 06/2008   spon passed    Past Surgical History:  Procedure Laterality Date  . ABLATION  11/24/08   Novasure  . CESAREAN SECTION  06/2003  . ENDOMETRIAL BIOPSY  10/30/08   benign  . [place]  2012  . KNEE SURGERY  06/1992 and 08/1992   ACL tore bone and second surgery was to remove scar tissue. Left knee  . TONSILECTOMY, ADENOIDECTOMY, BILATERAL MYRINGOTOMY AND TUBES  1974    Social History   Social History  . Marital status: Married    Spouse name: N/A  . Number of children: 1  . Years of education: N/A   Occupational History  . retirement office  [place] History Main Topics  . Smoking status: Never Smoker  . Smokeless tobacco: Never Used  . Alcohol use 1.2 - 1.8 oz/week    2 - 3 Standard drinks or equivalent per week     Comment: 2-3 glasses of wine a week  . Drug use: No  . Sexual activity: Yes    Partners: Male    Birth control/ protection: Other-see comments     Comment: vasectomy/novasure ablation 11/2008   Other Topics Concern  .  Not on file   Social History Narrative  . No narrative on file      Allergies as of 11/14/2016   No Known Allergies     Medication List    as of 11/14/2016 11:59 PM   You have not been prescribed any medications.        Objective:   Physical Exam BP 116/66 (BP Location: Left Arm, Patient Position: Sitting, Cuff Size: Small)   Pulse 66   Temp 97.9 F (36.6 C) (Oral)   Resp 14   Ht 5\' 6"  (1.676 m)   Wt 125 lb 4 oz (56.8 kg)   SpO2 97%   BMI 20.22 kg/m  General:   Well developed, well nourished . NAD.  HEENT:  Normocephalic . Face symmetric, atraumatic  Abdomen:  Not distended, soft, non-tender. No rebound or rigidity.  No CVA tenderness Back: Patient points to the very low right side of the back as the area where she hurt around the SI joint  Skin: Not pale. Not jaundice Neurologic:  alert & oriented X3.  Speech normal, gait appropriate for age and unassisted Psych--  Cognition and judgment appear intact.  Cooperative with normal attention span and concentration.  Behavior appropriate. No anxious or depressed appearing.     Assessment & Plan:  Assessment Kidney stones, 2010 Menopause  BC: s/p ablation d/t DUB   PLAN   Back and suprapubic pain: Exam is benign, Udip negative. By history, she may be passing a stone (currently at the bladder?). Recommend to drink plenty of fluids, Tylenol as needed, get a UA urine culture, call if not back to normal in few days. Call if worse. May need renal ultrasound vs CT. Patient verbalized understanding

## 2016-11-15 NOTE — Assessment & Plan Note (Signed)
Back and suprapubic pain: Exam is benign, Udip negative. By history, she may be passing a stone (currently at the bladder?). Recommend to drink plenty of fluids, Tylenol as needed, get a UA urine culture, call if not back to normal in few days. Call if worse. May need renal ultrasound vs CT. Patient verbalized understanding

## 2017-01-25 ENCOUNTER — Ambulatory Visit (INDEPENDENT_AMBULATORY_CARE_PROVIDER_SITE_OTHER): Payer: BLUE CROSS/BLUE SHIELD | Admitting: Obstetrics and Gynecology

## 2017-01-25 ENCOUNTER — Encounter: Payer: Self-pay | Admitting: Obstetrics and Gynecology

## 2017-01-25 VITALS — BP 116/60 | HR 68 | Resp 16 | Ht 66.75 in | Wt 124.0 lb

## 2017-01-25 DIAGNOSIS — Z01419 Encounter for gynecological examination (general) (routine) without abnormal findings: Secondary | ICD-10-CM | POA: Diagnosis not present

## 2017-01-25 NOTE — Progress Notes (Signed)
51 y.o. G1P1 Married Caucasian female here for annual exam.    No vaginal bleeding of spotting.   Had another bout of kidney stones.  Passed on her own.   Sees PCP at the end of this month.  Will do labs then.   ROS - hot flashes for 3 months and then they stop.  Has a hot flash once a week now.  Declines tx for menopausal symptoms.   Leaks urine with sneeze or jumping jack.   PCP: Dr. Larose Kells    No LMP recorded. Patient has had an ablation.           Sexually active: Yes.    The current method of family planning is vasectomy.    Exercising: Yes.    walking and weights Smoker:  no  Health Maintenance: Pap:  03/31/16 Neg:Neg HR HPV History of abnormal Pap:  no MMG:  05/12/16 BIRADS 1 negative/density c Colonoscopy:  04/18/16 -- Diverticulosis -- f/u 10 years BMD:   n/a  Result n/a TDaP:  Summer 2018 HIV: 02/01/16 Negative Hep C: not indicated  Screening Labs:  PCP   reports that she has never smoked. She has never used smokeless tobacco. She reports that she drinks about 1.2 - 1.8 oz of alcohol per week . She reports that she does not use drugs.  Past Medical History:  Diagnosis Date  . Contraception    husband's vasectomy  . Elevated serum creatinine 2017  . Family history of breast cancer    mother (67) and cousin (16)  . Family history of colon cancer   . Fibroid 02/08/2016   2 cm   . Kidney stones 06/2008   spon passed    Past Surgical History:  Procedure Laterality Date  . ABLATION  11/24/08   Novasure  . CESAREAN SECTION  06/2003  . ENDOMETRIAL BIOPSY  10/30/08   benign  . [place]  2012  . KNEE SURGERY  06/1992 and 08/1992   ACL tore bone and second surgery was to remove scar tissue. Left knee  . TONSILECTOMY, ADENOIDECTOMY, BILATERAL MYRINGOTOMY AND TUBES  1974    No current outpatient prescriptions on file.   No current facility-administered medications for this visit.     Family History  Problem Relation Age of Onset  . Breast cancer  Mother 45  . Mitral valve prolapse Mother   . Dementia Mother   . Colon cancer Paternal Grandmother        58's  . CAD Neg Hx   . Diabetes Neg Hx     ROS:  Pertinent items are noted in HPI.  Otherwise, a comprehensive ROS was negative.  Exam:   BP 116/60 (BP Location: Right Arm, Patient Position: Sitting, Cuff Size: Normal)   Pulse 68   Resp 16   Ht 5' 6.75" (1.695 m)   Wt 124 lb (56.2 kg)   BMI 19.57 kg/m     General appearance: alert, cooperative and appears stated age Head: Normocephalic, without obvious abnormality, atraumatic Neck: no adenopathy, supple, symmetrical, trachea midline and thyroid normal to inspection and palpation Lungs: clear to auscultation bilaterally Breasts: normal appearance, no masses or tenderness, No nipple retraction or dimpling, No nipple discharge or bleeding, No axillary or supraclavicular adenopathy Heart: regular rate and rhythm Abdomen: soft, non-tender; no masses, no organomegaly Extremities: extremities normal, atraumatic, no cyanosis or edema Skin: Skin color, texture, turgor normal. No rashes or lesions Lymph nodes: Cervical, supraclavicular, and axillary nodes normal. No abnormal inguinal nodes palpated  Neurologic: Grossly normal  Pelvic: External genitalia:  no lesions              Urethra:  normal appearing urethra with no masses, tenderness or lesions              Bartholins and Skenes: normal                 Vagina: normal appearing vagina with normal color and discharge, no lesions              Cervix: no lesions.  3 mm nabothian cyst at 8:00.               Pap taken: No. Bimanual Exam:  Uterus:  normal size, contour, position, consistency, mobility, non-tender              Adnexa: no mass, fullness, tenderness              Rectal exam: Yes.  .  Confirms.              Anus:  normal sphincter tone, no lesions  Chaperone was present for exam.  Assessment:   Well woman visit with normal exam. Menopausal symptoms.  Status post  endometrial ablation.  GSI.  Mild. FH of breast and colon cancer.  Hx nephrolithiasis.   Plan: Mammogram screening discussed.  Discussed 3D mammogram.  Recommended self breast awareness. Pap and HR HPV as above. Guidelines for Calcium, Vitamin D, regular exercise program including cardiovascular and weight bearing exercise. I did discuss briefly [place], physical therapy and surgery for urinary stress incontinence if desired. Labs through PCP.  No need to check [facility] and estradiol as patient is not seeking tx at this time.  Follow up annually and prn.   After visit summary provided.

## 2017-01-25 NOTE — Patient Instructions (Signed)

## 2017-02-02 ENCOUNTER — Encounter: Payer: Self-pay | Admitting: Internal Medicine

## 2017-02-02 ENCOUNTER — Ambulatory Visit (INDEPENDENT_AMBULATORY_CARE_PROVIDER_SITE_OTHER): Payer: BLUE CROSS/BLUE SHIELD | Admitting: Internal Medicine

## 2017-02-02 VITALS — BP 108/66 | HR 66 | Temp 97.8°F | Resp 14 | Ht 67.0 in | Wt 125.4 lb

## 2017-02-02 DIAGNOSIS — Z23 Encounter for immunization: Secondary | ICD-10-CM

## 2017-02-02 DIAGNOSIS — Z0000 Encounter for general adult medical examination without abnormal findings: Secondary | ICD-10-CM

## 2017-02-02 LAB — LIPID PANEL
CHOL/HDL RATIO: 2
CHOLESTEROL: 166 mg/dL (ref 0–200)
HDL: 72.1 mg/dL (ref >39.00)
LDL Cholesterol: 81 mg/dL (ref 0–99)
NonHDL: 93.55
TRIGLYCERIDES: 62 mg/dL (ref 0.0–149.0)
VLDL: 12.4 mg/dL (ref 0.0–40.0)

## 2017-02-02 LAB — CBC WITH DIFFERENTIAL/PLATELET
BASOS PCT: 0.7 % (ref 0.0–3.0)
Basophils Absolute: 0 10*3/uL (ref 0.0–0.1)
Eosinophils Absolute: 0.1 10*3/uL (ref 0.0–0.7)
Eosinophils Relative: 2.5 % (ref 0.0–5.0)
HEMATOCRIT: 41.7 % (ref 36.0–46.0)
Hemoglobin: 13.7 g/dL (ref 12.0–15.0)
Lymphocytes Relative: 35.4 % (ref 12.0–46.0)
Lymphs Abs: 1.7 10*3/uL (ref 0.7–4.0)
MCHC: 32.9 g/dL (ref 30.0–36.0)
MCV: 96.1 fl (ref 78.0–100.0)
MONO ABS: 0.4 10*3/uL (ref 0.1–1.0)
Monocytes Relative: 8.6 % (ref 3.0–12.0)
NEUTROS ABS: 2.5 10*3/uL (ref 1.4–7.7)
Neutrophils Relative %: 52.8 % (ref 43.0–77.0)
PLATELETS: 177 10*3/uL (ref 150.0–400.0)
RBC: 4.34 Mil/uL (ref 3.87–5.11)
RDW: 12.3 % (ref 11.5–15.5)
WBC: 4.7 10*3/uL (ref 4.0–10.5)

## 2017-02-02 LAB — COMPREHENSIVE METABOLIC PANEL
ALT: 24 U/L (ref 0–35)
AST: 22 U/L (ref 0–37)
Albumin: 4.5 g/dL (ref 3.5–5.2)
Alkaline Phosphatase: 52 U/L (ref 39–117)
BUN: 24 mg/dL — ABNORMAL HIGH (ref 6–23)
CHLORIDE: 104 meq/L (ref 96–112)
CO2: 30 meq/L (ref 19–32)
Calcium: 9.9 mg/dL (ref 8.4–10.5)
Creatinine, Ser: 0.82 mg/dL (ref 0.40–1.20)
GFR: 78.01 mL/min (ref >60.00)
Glucose, Bld: 84 mg/dL (ref 70–99)
Potassium: 4.3 mEq/L (ref 3.5–5.1)
Sodium: 140 mEq/L (ref 135–145)
Total Bilirubin: 0.8 mg/dL (ref 0.2–1.2)
Total Protein: 6.9 g/dL (ref 6.0–8.3)

## 2017-02-02 NOTE — Progress Notes (Signed)
Subjective:    Patient ID: Vanessa Schneider, female    DOB: 1965-07-19, 51 y.o.   MRN: [id number]  DOS:  02/02/2017 Type of visit - description : cpx Interval history:no  concerns    Review of Systems  A 14 point review of systems is negative    Past Medical History:  Diagnosis Date  . Contraception    husband's vasectomy  . Elevated serum creatinine 2017  . Family history of breast cancer    mother (57) and cousin (59)  . Family history of colon cancer   . Fibroid 02/08/2016   2 cm   . Kidney stones 06/2008   spon passed    Past Surgical History:  Procedure Laterality Date  . ABLATION  11/24/08   Novasure  . CESAREAN SECTION  06/2003  . ENDOMETRIAL BIOPSY  10/30/08   benign  . [place]  2012  . KNEE SURGERY  06/1992 and 08/1992   ACL tore bone and second surgery was to remove scar tissue. Left knee  . TONSILECTOMY, ADENOIDECTOMY, BILATERAL MYRINGOTOMY AND TUBES  1974    Social History   Social History  . Marital status: Married    Spouse name: N/A  . Number of children: 1  . 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 Smoking status: Never Smoker  . Smokeless tobacco: Never Used  . Alcohol use 1.2 - 1.8 oz/week    2 - 3 Standard drinks or equivalent per week     Comment: 2-3 glasses of wine a week  . Drug use: No  . Sexual activity: Yes    Partners: Male    Birth control/ protection: Other-see comments     Comment: vasectomy/novasure ablation 11/2008   Other Topics Concern  . Not on file   Social History Narrative   Household- pt , husband , daughter      Family History  Problem Relation Age of Onset  . Breast cancer Mother 42  . Mitral valve prolapse Mother   . Dementia Mother   . Colon cancer Paternal Grandmother        49's  . CAD Neg Hx   . Diabetes Neg Hx      Allergies as of 02/02/2017   No Known Allergies     Medication List    as of 02/02/2017  11:59 PM   You have not been prescribed any medications.        Objective:   Physical Exam BP 108/66 (BP Location: Left Arm, Patient Position: Sitting, Cuff Size: Small)   Pulse 66   Temp 97.8 F (36.6 C) (Oral)   Resp 14   Ht 5\' 7"  (1.702 m)   Wt 125 lb 6 oz (56.9 kg)   SpO2 98%   BMI 19.64 kg/m  General:   Well developed, well nourished . NAD.  Neck: No  thyromegaly  HEENT:  Normocephalic . Face symmetric, atraumatic Lungs:  CTA B Normal respiratory effort, no intercostal retractions, no accessory muscle use. Heart: RRR,  no murmur.  No pretibial edema bilaterally  Abdomen:  Not distended, soft, non-tender. No rebound or rigidity.   Skin: Exposed areas without rash. Not pale. Not jaundice Neurologic:  alert & oriented X3.  Speech normal, gait appropriate for age and unassisted Strength symmetric and appropriate for age.  Psych: Cognition and judgment appear intact.  Cooperative with normal attention span and concentration.  Behavior appropriate. No anxious or depressed appearing.     Assessment & Plan:   Assessment Kidney stones, 2010 Menopause ?  BC: s/p ablation d/t DUB   PLAN  Kidney stones: After last visit, he continued experiencing back pain and urinary urgency/frequency, symptoms all the sudden stopped, she saw no stone but she believes that is what happened. I agree, no further sxs. Observation. Menopausal? Unclear, status post ablation, occasional hot flashes. RTC one year

## 2017-02-02 NOTE — Patient Instructions (Signed)
GO TO THE LAB : Get the blood work     GO TO THE FRONT DESK Schedule your next appointment for a  physical exam in one year  

## 2017-02-02 NOTE — Progress Notes (Signed)
Pre visit review using our clinic review tool, if applicable. No additional management support is needed unless otherwise documented below in the visit note. 

## 2017-02-02 NOTE — Assessment & Plan Note (Addendum)
-  Td 11-2016 @ a UC;  flu shot today, s/e discussed -CCS: cscope no polyps 04-2016, 10 years -Female care per Dr Quincy Simmonds - dexa? Never had one, perimenopausal? We agreed not to check a dexa for now, get Vit D and Ca++ if so  desire. -Diet: very healthy  Exercises x3/week -labs: CMP, CBC, FLP -EKG for baseline: NSR

## 2017-02-03 NOTE — Assessment & Plan Note (Signed)
Kidney stones: After last visit, he continued experiencing back pain and urinary urgency/frequency, symptoms all the sudden stopped, she saw no stone but she believes that is what happened. I agree, no further sxs. Observation. Menopausal? Unclear, status post ablation, occasional hot flashes. RTC one year

## 2017-06-12 ENCOUNTER — Other Ambulatory Visit: Payer: Self-pay | Admitting: Obstetrics and Gynecology

## 2017-06-12 DIAGNOSIS — Z1231 Encounter for screening mammogram for malignant neoplasm of breast: Secondary | ICD-10-CM

## 2017-06-26 DIAGNOSIS — C4491 Basal cell carcinoma of skin, unspecified: Secondary | ICD-10-CM

## 2017-06-26 HISTORY — DX: Basal cell carcinoma of skin, unspecified: C44.91

## 2017-06-27 ENCOUNTER — Ambulatory Visit
Admission: RE | Admit: 2017-06-27 | Discharge: 2017-06-27 | Disposition: A | Discharge status: Home / Self Care | Payer: BLUE CROSS/BLUE SHIELD | Source: Ambulatory Visit | Attending: Obstetrics and Gynecology | Admitting: Obstetrics and Gynecology

## 2017-06-27 DIAGNOSIS — Z1231 Encounter for screening mammogram for malignant neoplasm of breast: Secondary | ICD-10-CM

## 2018-02-01 NOTE — Progress Notes (Signed)
52 y.o. G1P1 Married Caucasian female here for annual exam.    Patient complaining of vaginal discomfort/dryness with intercourse and decreased libido. Has declined medication to date.  Feeling more emotional.   More anxious about things than used to be.  Denies depression.   Some hot flashes.  They are lessened.   Stress with a 77 year old daughter.   PCP: Kathlene November, MD  No LMP recorded. Patient has had an ablation.     Period Cycle (Days): (no cyces--hx ablation)     Sexually active: Yes.    female The current method of family planning is vasectomy.    Exercising: Yes.    walking and weights 3x/week Smoker:  no  Health Maintenance: Pap: 03-31-16 Neg:Neg HR HPV, 01-08-15 Neg:Neg HR HPV History of abnormal Pap:  no MMG: 06-27-17 Neg/Density B/BiRads1 Colonoscopy: 04-18-16 normal;next 10 years BMD:   n/a  Result  n/a TDaP:  09/2016 Gardasil:   no HIV: 02-01-16 NR Hep C: not indicated Screening Labs:  Hb today: PCP   reports that she has never smoked. She has never used smokeless tobacco. She reports that she drinks about 2.0 - 3.0 standard drinks of alcohol per week. She reports that she does not use drugs.  Past Medical History:  Diagnosis Date  . Contraception    husband's vasectomy  . Elevated serum creatinine 2017  . Family history of breast cancer    mother (36) and cousin (7)  . Family history of colon cancer   . Fibroid 02/08/2016   2 cm   . Kidney stones 06/2008   spon passed    Past Surgical History:  Procedure Laterality Date  . ABLATION  11/24/08   Novasure  . CESAREAN SECTION  06/2003  . ENDOMETRIAL BIOPSY  10/30/08   benign  . [place]  2012  . KNEE SURGERY  06/1992 and 08/1992   ACL tore bone and second surgery was to remove scar tissue. Left knee  . TONSILECTOMY, ADENOIDECTOMY, BILATERAL MYRINGOTOMY AND TUBES  1974    No current outpatient medications on file.   No current facility-administered medications for this visit.     Family  History  Problem Relation Age of Onset  . Breast cancer Mother 109  . Mitral valve prolapse Mother   . Dementia Mother   . Colon cancer Paternal Grandmother        92's  . Breast cancer Cousin 43  . CAD Neg Hx   . Diabetes Neg Hx     Review of Systems  All other systems reviewed and are negative.   Exam:   BP 104/76 (BP Location: Right Arm, Patient Position: Sitting, Cuff Size: Normal)   Pulse 60   Resp 14   Ht 5' 6.5" (1.689 m)   Wt 126 lb 12.8 oz (57.5 kg)   BMI 20.16 kg/m     General appearance: alert, cooperative and appears stated age Head: Normocephalic, without obvious abnormality, atraumatic Neck: no adenopathy, supple, symmetrical, trachea midline and thyroid normal to inspection and palpation Lungs: clear to auscultation bilaterally Breasts: normal appearance, no masses or tenderness, No nipple retraction or dimpling, No nipple discharge or bleeding, No axillary or supraclavicular adenopathy Heart: regular rate and rhythm Abdomen: soft, non-tender; no masses, no organomegaly Extremities: extremities normal, atraumatic, no cyanosis or edema Skin: Skin color, texture, turgor normal. No rashes or lesions Lymph nodes: Cervical, supraclavicular, and axillary nodes normal. No abnormal inguinal nodes palpated Neurologic: Grossly normal  Pelvic: External genitalia:  no  lesions              Urethra:  normal appearing urethra with no masses, tenderness or lesions              Bartholins and Skenes: normal                 Vagina: normal appearing vagina with normal color and discharge, no lesions              Cervix: no lesions              Pap taken: No. Bimanual Exam:  Uterus:  normal size, contour, position, consistency, mobility, non-tender              Adnexa: no mass, fullness, tenderness              Rectal exam: Yes.  .  Confirms.              Anus:  normal sphincter tone, no lesions  Chaperone was present for exam.  Assessment:   Well woman visit with normal  exam. Menopausal symptoms.  Vaginal atrophy.  Decreased libido.  Status post endometrial ablation.  GSI.  Mild. FH of breast and colon cancer.  Mother had breast cancer.  Hx nephrolithiasis.  Stress and worry.   Plan: Mammogram screening. Recommended self breast awareness. Pap and HR HPV as above. Guidelines for Calcium, Vitamin D, regular exercise program including cardiovascular and weight bearing exercise. Will prescribe Premarin vaginal cream. Instructed in use.  Discussed potential effect on breast cancer.  Labs and flu vaccine with Dr. Larose Kells.  Referral to [facility] counseling.  Follow up annually and prn.    After visit summary provided.

## 2018-02-02 ENCOUNTER — Other Ambulatory Visit: Payer: Self-pay

## 2018-02-02 ENCOUNTER — Ambulatory Visit: Payer: BLUE CROSS/BLUE SHIELD | Admitting: Obstetrics and Gynecology

## 2018-02-02 ENCOUNTER — Encounter: Payer: Self-pay | Admitting: Obstetrics and Gynecology

## 2018-02-02 VITALS — BP 104/76 | HR 60 | Resp 14 | Ht 66.5 in | Wt 126.8 lb

## 2018-02-02 DIAGNOSIS — Z01419 Encounter for gynecological examination (general) (routine) without abnormal findings: Secondary | ICD-10-CM | POA: Diagnosis not present

## 2018-02-02 MED ORDER — ESTROGENS, CONJUGATED 0.625 MG/GM VA CREA: TOPICAL_CREAM | VAGINAL | 2 refills | Status: DC

## 2018-02-02 NOTE — Patient Instructions (Addendum)
EXERCISE AND DIET:  We recommended that you start or continue a regular exercise program for good health. Regular exercise means any activity that makes your heart beat faster and makes you sweat.  We recommend exercising at least 30 minutes per day at least 3 days a week, preferably 4 or 5.  We also recommend a diet low in fat and sugar.  Inactivity, poor dietary choices and obesity can cause diabetes, heart attack, stroke, and kidney damage, among others.    ALCOHOL AND SMOKING:  Women should limit their alcohol intake to no more than 7 drinks/beers/glasses of wine (combined, not each!) per week. Moderation of alcohol intake to this level decreases your risk of breast cancer and liver damage. And of course, no recreational drugs are part of a healthy lifestyle.  And absolutely no smoking or even second hand smoke. Most people know smoking can cause heart and lung diseases, but did you know it also contributes to weakening of your bones? Aging of your skin?  Yellowing of your teeth and nails?  CALCIUM AND VITAMIN D:  Adequate intake of calcium and Vitamin D are recommended.  The recommendations for exact amounts of these supplements seem to change often, but generally speaking 600 mg of calcium (either carbonate or citrate) and 800 units of Vitamin D per day seems prudent. Certain women may benefit from higher intake of Vitamin D.  If you are among these women, your doctor will have told you during your visit.    PAP SMEARS:  Pap smears, to check for cervical cancer or precancers,  have traditionally been done yearly, although recent scientific advances have shown that most women can have pap smears less often.  However, every woman still should have a physical exam from her gynecologist every year. It will include a breast check, inspection of the vulva and vagina to check for abnormal growths or skin changes, a visual exam of the cervix, and then an exam to evaluate the size and shape of the uterus and  ovaries.  And after 52 years of age, a rectal exam is indicated to check for rectal cancers. We will also provide age appropriate advice regarding health maintenance, like when you should have certain vaccines, screening for sexually transmitted diseases, bone density testing, colonoscopy, mammograms, etc.   MAMMOGRAMS:  All women over 40 years old should have a yearly mammogram. Many facilities now offer a "3D" mammogram, which may cost around $50 extra out of pocket. If possible,  we recommend you accept the option to have the 3D mammogram performed.  It both reduces the number of women who will be called back for extra views which then turn out to be normal, and it is better than the routine mammogram at detecting truly abnormal areas.    COLONOSCOPY:  Colonoscopy to screen for colon cancer is recommended for all women at age 50.  We know, you hate the idea of the prep.  We agree, BUT, having colon cancer and not knowing it is worse!!  Colon cancer so often starts as a polyp that can be seen and removed at colonscopy, which can quite literally save your life!  And if your first colonoscopy is normal and you have no family history of colon cancer, most women don't have to have it again for 10 years.  Once every ten years, you can do something that may end up saving your life, right?  We will be happy to help you get it scheduled when you are ready.    Be sure to check your insurance coverage so you understand how much it will cost.  It may be covered as a preventative service at no cost, but you should check your particular policy.     Atrophic Vaginitis Atrophic vaginitis is a condition in which the tissues that line the vagina become dry and thin. This condition is most common in women who have stopped having regular menstrual periods (menopause). This usually starts when a woman is 45-55 years old. Estrogen helps to keep the vagina moist. It stimulates the vagina to produce a clear fluid that lubricates  the vagina for sexual intercourse. This fluid also protects the vagina from infection. Lack of estrogen can cause the lining of the vagina to get thinner and dryer. The vagina may also shrink in size. It may become less elastic. Atrophic vaginitis tends to get worse over time as a woman's estrogen level drops. What are the causes? This condition is caused by the normal drop in estrogen that happens around the time of menopause. What increases the risk? Certain conditions or situations may lower a woman's estrogen level, which increases her risk of atrophic vaginitis. These include:  Taking medicine that blocks estrogen.  Having ovaries removed surgically.  Being treated for cancer with X-ray treatment (radiation) or medicines (chemotherapy).  Exercising very hard and often.  Having an eating disorder (anorexia).  Giving birth or breastfeeding.  Being over the age of 50.  Smoking.  What are the signs or symptoms? Symptoms of this condition include:  Pain, soreness, or bleeding during sexual intercourse (dyspareunia).  Vaginal burning, irritation, or itching.  Pain or bleeding during a vaginal examination using a speculum (pelvic exam).  Loss of interest in sexual activity.  Having burning pain when passing urine.  Vaginal discharge that is brown or yellow.  In some cases, there are no symptoms. How is this diagnosed? This condition is diagnosed with a medical history and physical exam. This will include a pelvic exam that checks whether the inside of your vagina appears pale, thin, or dry. Rarely, you may also have other tests, including:  A urine test.  A test that checks the acid balance in your vaginal fluid (acid balance test).  How is this treated? Treatment for this condition may depend on the severity of your symptoms. Treatment may include:  Using an over-the-counter vaginal lubricant before you have sexual intercourse.  Using a long-acting vaginal  moisturizer.  Using low-dose vaginal estrogen for moderate to severe symptoms that do not respond to other treatments. Options include creams, tablets, and inserts (vaginal rings). Before using vaginal estrogen, tell your health care provider if you have a history of: ? Breast cancer. ? Endometrial cancer. ? Blood clots.  Taking medicines. You may be able to take a daily pill for dyspareunia. Discuss all of the risks of this medicine with your health care provider. It is usually not recommended for women who have a family history or personal history of breast cancer.  If your symptoms are very mild and you are not sexually active, you may not need treatment. Follow these instructions at home:  Take medicines only as directed by your health care provider. Do not use herbal or alternative medicines unless your health care provider says that you can.  Use over-the-counter creams, lubricants, or moisturizers for dryness only as directed by your health care provider.  If your atrophic vaginitis is caused by menopause, discuss all of your menopausal symptoms and treatment options with your health care   provider.  Do not douche.  Do not use products that can make your vagina dry. These include: ? Scented feminine sprays. ? Scented tampons. ? Scented soaps.  If it hurts to have sex, talk with your sexual partner. Contact a health care provider if:  Your discharge looks different than normal.  Your vagina has an unusual smell.  You have new symptoms.  Your symptoms do not improve with treatment.  Your symptoms get worse. This information is not intended to replace advice given to you by your health care provider. Make sure you discuss any questions you have with your health care provider. Document Released: 08/12/2014 Document Revised: 09/03/2015 Document Reviewed: 03/19/2014 Elsevier Interactive Patient Education  2018 [employer]vier Inc.  

## 2018-02-06 ENCOUNTER — Encounter: Payer: Self-pay | Admitting: Internal Medicine

## 2018-02-06 ENCOUNTER — Ambulatory Visit (INDEPENDENT_AMBULATORY_CARE_PROVIDER_SITE_OTHER): Payer: BLUE CROSS/BLUE SHIELD | Admitting: Internal Medicine

## 2018-02-06 VITALS — BP 116/74 | HR 67 | Temp 97.7°F | Resp 16 | Ht 67.0 in | Wt 126.2 lb

## 2018-02-06 DIAGNOSIS — Z0000 Encounter for general adult medical examination without abnormal findings: Secondary | ICD-10-CM | POA: Diagnosis not present

## 2018-02-06 DIAGNOSIS — Z23 Encounter for immunization: Secondary | ICD-10-CM | POA: Diagnosis not present

## 2018-02-06 LAB — COMPREHENSIVE METABOLIC PANEL
ALBUMIN: 4.9 g/dL (ref 3.5–5.2)
ALK PHOS: 66 U/L (ref 39–117)
ALT: 36 U/L — AB (ref 0–35)
AST: 22 U/L (ref 0–37)
BILIRUBIN TOTAL: 0.9 mg/dL (ref 0.2–1.2)
BUN: 25 mg/dL — AB (ref 6–23)
CO2: 30 mEq/L (ref 19–32)
Calcium: 10.3 mg/dL (ref 8.4–10.5)
Chloride: 102 mEq/L (ref 96–112)
Creatinine, Ser: 0.74 mg/dL (ref 0.40–1.20)
GFR: 87.48 mL/min (ref >60.00)
Glucose, Bld: 84 mg/dL (ref 70–99)
POTASSIUM: 4.4 meq/L (ref 3.5–5.1)
Sodium: 139 mEq/L (ref 135–145)
TOTAL PROTEIN: 6.9 g/dL (ref 6.0–8.3)

## 2018-02-06 LAB — LIPID PANEL
CHOLESTEROL: 188 mg/dL (ref 0–200)
HDL: 79.5 mg/dL (ref >39.00)
LDL Cholesterol: 82 mg/dL (ref 0–99)
NonHDL: 108.6
Total CHOL/HDL Ratio: 2
Triglycerides: 131 mg/dL (ref 0.0–149.0)
VLDL: 26.2 mg/dL (ref 0.0–40.0)

## 2018-02-06 LAB — CBC WITH DIFFERENTIAL/PLATELET
BASOS PCT: 0.6 % (ref 0.0–3.0)
Basophils Absolute: 0 10*3/uL (ref 0.0–0.1)
EOS PCT: 6.4 % — AB (ref 0.0–5.0)
Eosinophils Absolute: 0.3 10*3/uL (ref 0.0–0.7)
HCT: 41.1 % (ref 36.0–46.0)
Hemoglobin: 13.9 g/dL (ref 12.0–15.0)
Lymphocytes Relative: 28.2 % (ref 12.0–46.0)
Lymphs Abs: 1.4 10*3/uL (ref 0.7–4.0)
MCHC: 33.9 g/dL (ref 30.0–36.0)
MCV: 93.4 fl (ref 78.0–100.0)
MONO ABS: 0.5 10*3/uL (ref 0.1–1.0)
MONOS PCT: 9.1 % (ref 3.0–12.0)
NEUTROS PCT: 55.7 % (ref 43.0–77.0)
Neutro Abs: 2.9 10*3/uL (ref 1.4–7.7)
PLATELETS: 183 10*3/uL (ref 150.0–400.0)
RBC: 4.4 Mil/uL (ref 3.87–5.11)
RDW: 13.1 % (ref 11.5–15.5)
WBC: 5.1 10*3/uL (ref 4.0–10.5)

## 2018-02-06 LAB — TSH: TSH: 3.62 u[IU]/mL (ref 0.35–4.50)

## 2018-02-06 NOTE — Assessment & Plan Note (Signed)
-  Td 11-2016 @ a UC;  flu shot today  -CCS: cscope no polyps 04-2016, 10 years -Female care per Dr Quincy Simmonds -No indication for bone density test -She has a very healthy lifestyle.  Actually 2 months ago started to be a low carbohydrate diet as part of a pilot program at work, feels slightly tired, lost 1 pound.  Recommend to go back to a normal, healthy diet.  Continue to stay physically active -Labs: CMP, FLP, CBC, TSH, vitamin D - recommend a multivitamin along with vitamin D tablet supplement daily.

## 2018-02-06 NOTE — Patient Instructions (Signed)
GO TO THE LAB : Get the blood work     GO TO THE FRONT DESK Schedule your next appointment   for a physical exam in 1 year 

## 2018-02-06 NOTE — Assessment & Plan Note (Signed)
Here for CPX Doing well. RTC 1 year 

## 2018-02-06 NOTE — Progress Notes (Signed)
Pre visit review using our clinic review tool, if applicable. No additional management support is needed unless otherwise documented below in the visit note. 

## 2018-02-06 NOTE — Progress Notes (Signed)
Subjective:    Patient ID: Vanessa Schneider, female    DOB: 1965-09-06, 52 y.o.   MRN: [id number]  DOS:  02/06/2018 Type of visit - description : CPX No major concerns, feels well.    Review of Systems  A 14 point review of systems is negative    Past Medical History:  Diagnosis Date  . Contraception    husband's vasectomy  . Elevated serum creatinine 2017  . Family history of breast cancer    mother (67) and cousin (28)  . Family history of colon cancer   . Fibroid 02/08/2016   2 cm   . Kidney stones 06/2008   spon passed    Past Surgical History:  Procedure Laterality Date  . ABLATION  11/24/08   Novasure  . CESAREAN SECTION  06/2003  . ENDOMETRIAL BIOPSY  10/30/08   benign  . [place]  2012  . KNEE SURGERY  06/1992 and 08/1992   ACL tore bone and second surgery was to remove scar tissue. Left knee  . TONSILECTOMY, ADENOIDECTOMY, BILATERAL MYRINGOTOMY AND TUBES  1974    Social History   Socioeconomic History  . Marital status: Married    Spouse name: Not on file  . Number of children: 1  . Years of education: Not on file  . Highest education level: Not on file  Occupational History  . 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 Financial resource strain: Not on file  . Food insecurity:    Worry: Not on file    Inability: Not on file  . Transportation needs:    Medical: Not on file    Non-medical: Not on file  Tobacco Use  . Smoking status: Never Smoker  . Smokeless tobacco: Never Used  Substance and Sexual Activity  . Alcohol use: Yes    Alcohol/week: 2.0 - 3.0 standard drinks    Types: 2 - 3 Standard drinks or equivalent per week    Comment: 2-3 glasses of wine a week  . Drug use: No  . Sexual activity: Yes    Partners: Male    Birth control/protection: Other-see comments    Comment: vasectomy/novasure ablation 11/2008  Lifestyle  . Physical activity:    Days per week: Not on file    Minutes per  session: Not on file  . Stress: Not on file  Relationships  . Social connections:    Talks on phone: Not on file    Gets together: Not on file    Attends religious service: Not on file    Active member of club or organization: Not on file    Attends meetings of clubs or organizations: Not on file    Relationship status: Not on file  . Intimate partner violence:    Fear of current or ex partner: Not on file    Emotionally abused: Not on file    Physically abused: Not on file    Forced sexual activity: Not on file  Other Topics Concern  . Not on file  Social History Narrative   Household- pt , husband, daughter      Family History  Problem Relation Age of Onset  . Breast cancer Mother 43  . Mitral valve prolapse Mother   . Dementia Mother   . Colon cancer Paternal Grandmother        67's  . Breast cancer Cousin 52  . CAD Neg Hx   . Diabetes Neg  Hx      Allergies as of 02/06/2018   No Known Allergies     Medication List        Accurate as of 02/06/18  3:42 PM. Always use your most recent med list.          conjugated estrogens vaginal cream Commonly known as:  PREMARIN Use 1/2 g vaginally every night at bed time for the first 2 weeks, then use 1/2 g vaginally two times per week.          Objective:   Physical Exam BP 116/74 (BP Location: Left Arm, Patient Position: Sitting, Cuff Size: Small)   Pulse 67   Temp 97.7 F (36.5 C) (Oral)   Resp 16   Ht 5\' 7"  (1.702 m)   Wt 126 lb 4 oz (57.3 kg)   SpO2 97%   BMI 19.77 kg/m  General: Well developed, NAD, see BMI.  Neck: No  thyromegaly  HEENT:  Normocephalic . Face symmetric, atraumatic Lungs:  CTA B Normal respiratory effort, no intercostal retractions, no accessory muscle use. Heart: RRR,  no murmur.  No pretibial edema bilaterally  Abdomen:  Not distended, soft, non-tender. No rebound or rigidity.   Skin: Exposed areas without rash. Not pale. Not jaundice Neurologic:  alert & oriented X3.    Speech normal, gait appropriate for age and unassisted Strength symmetric and appropriate for age.  Psych: Cognition and judgment appear intact.  Cooperative with normal attention span and concentration.  Behavior appropriate. No anxious or depressed appearing.     Assessment & Plan:   Assessment Kidney stones, 2010 Menopause ?  BC: s/p ablation d/t DUB   PLAN  Here for CPX Doing well RTC 1 year

## 2018-02-09 LAB — VITAMIN D 1,25 DIHYDROXY
Vitamin D 1, 25 (OH)2 Total: 77 pg/mL — ABNORMAL HIGH (ref 18–72)
Vitamin D3 1, 25 (OH)2: 77 pg/mL

## 2018-07-30 ENCOUNTER — Other Ambulatory Visit: Payer: Self-pay | Admitting: Obstetrics and Gynecology

## 2018-07-30 DIAGNOSIS — Z1231 Encounter for screening mammogram for malignant neoplasm of breast: Secondary | ICD-10-CM

## 2018-09-24 ENCOUNTER — Ambulatory Visit: Payer: BLUE CROSS/BLUE SHIELD

## 2018-09-25 DIAGNOSIS — C4491 Basal cell carcinoma of skin, unspecified: Secondary | ICD-10-CM

## 2018-09-25 HISTORY — DX: Basal cell carcinoma of skin, unspecified: C44.91

## 2018-10-02 ENCOUNTER — Ambulatory Visit
Admission: RE | Admit: 2018-10-02 | Discharge: 2018-10-02 | Disposition: A | Discharge status: Home / Self Care | Payer: BC Managed Care – PPO | Source: Ambulatory Visit | Attending: Obstetrics and Gynecology | Admitting: Obstetrics and Gynecology

## 2018-10-02 ENCOUNTER — Other Ambulatory Visit: Payer: Self-pay

## 2018-10-02 DIAGNOSIS — Z1231 Encounter for screening mammogram for malignant neoplasm of breast: Secondary | ICD-10-CM

## 2018-12-14 ENCOUNTER — Telehealth: Payer: Self-pay | Admitting: Obstetrics and Gynecology

## 2018-12-14 NOTE — Telephone Encounter (Signed)
Left message on voicemail to call and reschedule cancelled appointment. °

## 2019-02-08 ENCOUNTER — Other Ambulatory Visit: Payer: Self-pay

## 2019-02-08 ENCOUNTER — Encounter: Payer: Self-pay | Admitting: Internal Medicine

## 2019-02-08 ENCOUNTER — Ambulatory Visit (INDEPENDENT_AMBULATORY_CARE_PROVIDER_SITE_OTHER): Payer: BC Managed Care – PPO | Admitting: Internal Medicine

## 2019-02-08 DIAGNOSIS — Z20828 Contact with and (suspected) exposure to other viral communicable diseases: Secondary | ICD-10-CM | POA: Diagnosis not present

## 2019-02-08 DIAGNOSIS — Z0000 Encounter for general adult medical examination without abnormal findings: Secondary | ICD-10-CM

## 2019-02-08 DIAGNOSIS — Z20822 Contact with and (suspected) exposure to covid-19: Secondary | ICD-10-CM

## 2019-02-08 NOTE — Progress Notes (Signed)
Subjective:    Patient ID: Vanessa Schneider, female    DOB: May 14, 1965, 52 y.o.   MRN: [id number]  DOS:  02/08/2019 Type of visit - description: Virtual Visit via Video Note  I connected with@   by a video enabled telemedicine application and verified that I am speaking with the correct person using two identifiers.   THIS ENCOUNTER IS A VIRTUAL VISIT DUE TO COVID-19 - PATIENT WAS NOT SEEN IN THE OFFICE. PATIENT HAS CONSENTED TO VIRTUAL VISIT / TELEMEDICINE VISIT   Location of patient: home  Location of provider: office  I discussed the limitations of evaluation and management by telemedicine and the availability of in person appointments. The patient expressed understanding and agreed to proceed.  History of Present Illness:  Here for CPX Feels well Over the last year, she has noted  has a much harder time keeping her weight controlled She remains very active, walking, running.   Did have 3 falls while running .  She rode in the same car with a friend 8 days ago, her friend developed a cold and now is COVID-19 positive. Patient remains asymptomatic but is concerned   Review of Systems   Other than above, a 14 point review of systems is negative     Past Medical History:  Diagnosis Date  . 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 Atypical nevus 07/11/2007   Right Outer Thigh Sup - Severe  . Atypical nevus 07/11/2007   Right Outer Thigh Inf - Moderate to Marked  . Atypical nevus 01/23/2013   Lower Mid Back - Moderate to Severe  . Atypical nevus 03/17/2014   Right Post Thigh - Mild  . Atypical nevus 10/17/2016   Right Buttock - Mild  . Contraception    husband's vasectomy  . Elevated serum creatinine 2017  . Family history of breast cancer    mother (32) and cousin (34)  . Family history of colon cancer   . Fibroid 02/08/2016   2 cm   . Kidney stones 06/2008   spon passed    Past Surgical History:  Procedure Laterality Date  .  ABLATION  11/24/08   Novasure  . CESAREAN SECTION  06/2003  . ENDOMETRIAL BIOPSY  10/30/08   benign  . [place]  2012  . KNEE SURGERY  06/1992 and 08/1992   ACL tore bone and second surgery was to remove scar tissue. Left knee  . TONSILECTOMY, ADENOIDECTOMY, BILATERAL MYRINGOTOMY AND TUBES  1974    Social History   Socioeconomic History  . Marital status: Married    Spouse name: Not on file  . Number of children: 1  . Years of education: Not on file  . Highest education level: Not on file  Occupational History  . 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 Financial resource strain: Not on file  . Food insecurity    Worry: Not on file    Inability: Not on file  . Transportation needs    Medical: Not on file    Non-medical: Not on file  Tobacco Use  . Smoking status: Never Smoker  . Smokeless tobacco: Never Used  Substance and Sexual Activity  . Alcohol use: Yes    Alcohol/week: 2.0 - 3.0 standard drinks    Types: 2 - 3 Standard drinks or equivalent per week    Comment: 2-3 glasses of wine a week  . Drug  use: No  . Sexual activity: Yes    Partners: Male    Birth control/protection: Other-see comments    Comment: vasectomy/novasure ablation 11/2008  Lifestyle  . Physical activity    Days per week: Not on file    Minutes per session: Not on file  . Stress: Not on file  Relationships  . Social Herbalist on phone: Not on file    Gets together: Not on file    Attends religious service: Not on file    Active member of club or organization: Not on file    Attends meetings of clubs or organizations: Not on file    Relationship status: Not on file  . Intimate partner violence    Fear of current or ex partner: Not on file    Emotionally abused: Not on file    Physically abused: Not on file    Forced sexual activity: Not on file  Other Topics Concern  . Not on file  Social History Narrative   Household- pt , husband,  daughter      Family History  Problem Relation Age of Onset  . Breast cancer Mother 71  . Mitral valve prolapse Mother   . Dementia Mother   . Colon cancer Paternal Grandmother        56's  . Breast cancer Cousin 23  . CAD Neg Hx   . Diabetes Neg Hx      Allergies as of 02/08/2019   No Known Allergies     Medication List       Accurate as of February 08, 2019 11:59 PM. If you have any questions, ask your nurse or doctor.        conjugated estrogens vaginal cream Commonly known as: Premarin Use 1/2 g vaginally every night at bed time for the first 2 weeks, then use 1/2 g vaginally two times per week.           Objective:   Physical Exam There were no vitals taken for this visit. This is a virtual video visit, she is alert oriented x3, in no apparent distress, seems in good spirits    Assessment     Assessment Kidney stones, 2010 Menopause ?  BC: s/p ablation d/t DUB  Sees dermatology  PLAN  Here for CPX, done virtually due to recent exposure to a COVID-19 positive patient. Menopause?  Had some hot flashes, to see gynecology soon.  Was prescribed Premarin vaginal cream but never used it. COVID-19 exposure: This happened approximately 8 days, the patient remains asymptomatic, we will go ahead and order Covid testing.  If she has minor symptoms next to treated as a cold, and let me know. RTC 1 year   I discussed the assessment and treatment plan with the patient. The patient was provided an opportunity to ask questions and all were answered. The patient agreed with the plan and demonstrated an understanding of the instructions.   The patient was advised to call back or seek an in-person evaluation if the symptoms worsen or if the condition fails to improve as anticipated.

## 2019-02-09 NOTE — Assessment & Plan Note (Signed)
-  Td 11-2016 @ a UC -  flu shot: We will do in 10 days - shingrix: Discussed, will think about it -CCS: cscope no polyps 04-2016, 10 years -Female care per Dr Quincy Simmonds -No indication for bone density test -Diet: The patient is having a hard time keeping her weight down, with talk about calorie counting, Mediterranean diet, and a commercial plan called NOOM - Exercise: She remains active she did have 3 falls while running, I emphasized the need to stay safe and alert while running.  Call if further problems. -Labs: We will proceed with labs in 10 days because she was exposed to Covid:CMP, FLP, CBC, TSH -Continue vitamin D

## 2019-02-09 NOTE — Assessment & Plan Note (Signed)
Here for CPX, done virtually due to recent exposure to a COVID-19 positive patient. Menopause?  Had some hot flashes, to see gynecology soon.  Was prescribed Premarin vaginal cream but never used it. COVID-19 exposure: This happened approximately 8 days, the patient remains asymptomatic, we will go ahead and order Covid testing.  If she has minor symptoms next to treated as a cold, and let me know. RTC 1 year

## 2019-02-10 LAB — NOVEL CORONAVIRUS, NAA: SARS-CoV-2, NAA: NOT DETECTED

## 2019-02-15 ENCOUNTER — Ambulatory Visit: Payer: BLUE CROSS/BLUE SHIELD | Admitting: Obstetrics and Gynecology

## 2019-02-19 ENCOUNTER — Ambulatory Visit (INDEPENDENT_AMBULATORY_CARE_PROVIDER_SITE_OTHER): Payer: BC Managed Care – PPO

## 2019-02-19 ENCOUNTER — Other Ambulatory Visit (INDEPENDENT_AMBULATORY_CARE_PROVIDER_SITE_OTHER): Payer: BC Managed Care – PPO

## 2019-02-19 ENCOUNTER — Other Ambulatory Visit: Payer: Self-pay

## 2019-02-19 DIAGNOSIS — Z23 Encounter for immunization: Secondary | ICD-10-CM | POA: Diagnosis not present

## 2019-02-19 DIAGNOSIS — Z0000 Encounter for general adult medical examination without abnormal findings: Secondary | ICD-10-CM | POA: Diagnosis not present

## 2019-02-19 LAB — CBC WITH DIFFERENTIAL/PLATELET
Basophils Absolute: 0 10*3/uL (ref 0.0–0.1)
Basophils Relative: 0.6 % (ref 0.0–3.0)
Eosinophils Absolute: 0.1 10*3/uL (ref 0.0–0.7)
Eosinophils Relative: 2.9 % (ref 0.0–5.0)
HCT: 39.4 % (ref 36.0–46.0)
Hemoglobin: 13.3 g/dL (ref 12.0–15.0)
Lymphocytes Relative: 37.2 % (ref 12.0–46.0)
Lymphs Abs: 1.8 10*3/uL (ref 0.7–4.0)
MCHC: 33.7 g/dL (ref 30.0–36.0)
MCV: 93.2 fl (ref 78.0–100.0)
Monocytes Absolute: 0.4 10*3/uL (ref 0.1–1.0)
Monocytes Relative: 9.2 % (ref 3.0–12.0)
Neutro Abs: 2.4 10*3/uL (ref 1.4–7.7)
Neutrophils Relative %: 50.1 % (ref 43.0–77.0)
Platelets: 180 10*3/uL (ref 150.0–400.0)
RBC: 4.23 Mil/uL (ref 3.87–5.11)
RDW: 13.3 % (ref 11.5–15.5)
WBC: 4.8 10*3/uL (ref 4.0–10.5)

## 2019-02-19 LAB — LIPID PANEL
Cholesterol: 190 mg/dL (ref 0–200)
HDL: 81.3 mg/dL (ref >39.00)
LDL Cholesterol: 91 mg/dL (ref 0–99)
NonHDL: 108.4
Total CHOL/HDL Ratio: 2
Triglycerides: 89 mg/dL (ref 0.0–149.0)
VLDL: 17.8 mg/dL (ref 0.0–40.0)

## 2019-02-19 LAB — COMPREHENSIVE METABOLIC PANEL
ALT: 28 U/L (ref 0–35)
AST: 25 U/L (ref 0–37)
Albumin: 4.5 g/dL (ref 3.5–5.2)
Alkaline Phosphatase: 65 U/L (ref 39–117)
BUN: 20 mg/dL (ref 6–23)
CO2: 30 mEq/L (ref 19–32)
Calcium: 9.4 mg/dL (ref 8.4–10.5)
Chloride: 102 mEq/L (ref 96–112)
Creatinine, Ser: 0.7 mg/dL (ref 0.40–1.20)
GFR: 87.41 mL/min (ref >60.00)
Glucose, Bld: 90 mg/dL (ref 70–99)
Potassium: 4.2 mEq/L (ref 3.5–5.1)
Sodium: 139 mEq/L (ref 135–145)
Total Bilirubin: 0.8 mg/dL (ref 0.2–1.2)
Total Protein: 6.4 g/dL (ref 6.0–8.3)

## 2019-02-19 LAB — TSH: TSH: 3.71 u[IU]/mL (ref 0.35–4.50)

## 2019-02-22 ENCOUNTER — Other Ambulatory Visit: Payer: Self-pay

## 2019-02-26 ENCOUNTER — Encounter: Payer: Self-pay | Admitting: Obstetrics and Gynecology

## 2019-02-26 ENCOUNTER — Ambulatory Visit: Payer: BC Managed Care – PPO | Admitting: Obstetrics and Gynecology

## 2019-02-26 ENCOUNTER — Other Ambulatory Visit: Payer: Self-pay

## 2019-02-26 VITALS — BP 100/62 | HR 66 | Temp 97.2°F | Resp 14 | Ht 66.0 in | Wt 135.2 lb

## 2019-02-26 DIAGNOSIS — Z01419 Encounter for gynecological examination (general) (routine) without abnormal findings: Secondary | ICD-10-CM

## 2019-02-26 NOTE — Patient Instructions (Signed)

## 2019-02-26 NOTE — Progress Notes (Signed)
53 y.o. G1P1 Married Caucasian female here for annual exam.    Working from home.   Has some hot flashes that are increasing.  Tolerable.  She notices her mood is different sometimes.  No bleeding or spotting.   Gained weight this year.  Started running.   Labs with PCP.  PCP: Kathlene November, MD    No LMP recorded. Patient has had an ablation.           Sexually active: Yes.    The current method of family planning is vasectomy/ablation.    Exercising: Yes.    walk/run 4.5 miles 3x/week Smoker:  no  Health Maintenance: Pap: 03-31-16 Neg:Neg HR HPV, 01-08-15 Neg:Neg HR HPV History of abnormal Pap:  no MMG: 10-02-18 3D/Neg/density C/BiRads1 Colonoscopy:  04-18-16 normal;next 10 years BMD:   n/a  Result  n/a TDaP:  09/2016 Gardasil:   no HIV:02-01-16 NR Hep C: not indicated Screening Labs:  PCP.  Flu vaccine:  Completed.   reports that she has never smoked. She has never used smokeless tobacco. She reports current alcohol use of about 2.0 - 3.0 standard drinks of alcohol per week. She reports that she does not use drugs.  Past Medical History:  Diagnosis Date  . 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 Atypical nevus 07/11/2007   Right Outer Thigh Sup - Severe  . Atypical nevus 07/11/2007   Right Outer Thigh Inf - Moderate to Marked  . Atypical nevus 01/23/2013   Lower Mid Back - Moderate to Severe  . Atypical nevus 03/17/2014   Right Post Thigh - Mild  . Atypical nevus 10/17/2016   Right Buttock - Mild  . Contraception    husband's vasectomy  . Elevated serum creatinine 2017  . Family history of breast cancer    mother (32) and cousin (86)  . Family history of colon cancer   . Fibroid 02/08/2016   2 cm   . Kidney stones 06/2008   spon passed    Past Surgical History:  Procedure Laterality Date  . ABLATION  11/24/08   Novasure  . CESAREAN SECTION  06/2003  . ENDOMETRIAL BIOPSY  10/30/08   benign  . [place]  2012  . KNEE  SURGERY  06/1992 and 08/1992   ACL tore bone and second surgery was to remove scar tissue. Left knee  . TONSILECTOMY, ADENOIDECTOMY, BILATERAL MYRINGOTOMY AND TUBES  1974    Current Outpatient Medications  Medication Sig Dispense Refill  . conjugated estrogens (PREMARIN) vaginal cream Use 1/2 g vaginally every night at bed time for the first 2 weeks, then use 1/2 g vaginally two times per week. (Patient not taking: Reported on 02/26/2019) 30 g 2   No current facility-administered medications for this visit.     Family History  Problem Relation Age of Onset  . Breast cancer Mother 28  . Mitral valve prolapse Mother   . Dementia Mother   . Colon cancer Paternal Grandmother        80's  . Breast cancer Cousin 89  . CAD Neg Hx   . Diabetes Neg Hx     Review of Systems  All other systems reviewed and are negative.   Exam:   BP 100/62   Pulse 66   Temp (!) 97.2 F (36.2 C) (Temporal)   Resp 14   Ht 5\' 6"  (1.676 m)   Wt 135 lb 3.2 oz (61.3 kg)   BMI 21.82 kg/m  General appearance: alert, cooperative and appears stated age Head: normocephalic, without obvious abnormality, atraumatic Neck: no adenopathy, supple, symmetrical, trachea midline and thyroid normal to inspection and palpation Lungs: clear to auscultation bilaterally Breasts: normal appearance, no masses or tenderness, No nipple retraction or dimpling, No nipple discharge or bleeding, No axillary adenopathy Heart: regular rate and rhythm Abdomen: soft, non-tender; no masses, no organomegaly Extremities: extremities normal, atraumatic, no cyanosis or edema Skin: skin color, texture, turgor normal. No rashes or lesions Lymph nodes: cervical, supraclavicular, and axillary nodes normal. Neurologic: grossly normal  Pelvic: External genitalia:  no lesions              No abnormal inguinal nodes palpated.              Urethra:  normal appearing urethra with no masses, tenderness or lesions              Bartholins and  Skenes: normal                 Vagina: normal appearing vagina with normal color and discharge, no lesions              Cervix: no lesions              Pap taken: No. Bimanual Exam:  Uterus:  normal size, contour, position, consistency, mobility, non-tender              Adnexa: no mass, fullness, tenderness              Rectal exam: Yes.  .  Confirms.              Anus:  normal sphincter tone, no lesions  Chaperone was present for exam.  Assessment:   Well woman visit with normal exam. Menopausal symptoms.  Vaginal atrophy.  Status post endometrial ablation.  GSI. Mild. FH of breast and colon cancer.  Mother had breast cancer.  Hx nephrolithiasis.  Plan: Mammogram screening discussed. Self breast awareness reviewed. Pap and HR HPV in 2022.  New guidelines discussed. Guidelines for Calcium, Vitamin D, regular exercise program including cardiovascular and weight bearing exercise. Labs with PCP.  Follow up annually and prn.   After visit summary provided.

## 2019-08-22 ENCOUNTER — Other Ambulatory Visit: Payer: Self-pay | Admitting: Obstetrics and Gynecology

## 2019-08-22 DIAGNOSIS — Z1231 Encounter for screening mammogram for malignant neoplasm of breast: Secondary | ICD-10-CM

## 2019-10-03 ENCOUNTER — Ambulatory Visit
Admission: RE | Admit: 2019-10-03 | Discharge: 2019-10-03 | Disposition: A | Discharge status: Home / Self Care | Payer: BC Managed Care – PPO | Source: Ambulatory Visit

## 2019-10-03 DIAGNOSIS — Z1231 Encounter for screening mammogram for malignant neoplasm of breast: Secondary | ICD-10-CM

## 2020-02-11 ENCOUNTER — Other Ambulatory Visit: Payer: Self-pay

## 2020-02-11 ENCOUNTER — Encounter: Payer: Self-pay | Admitting: Internal Medicine

## 2020-02-11 ENCOUNTER — Ambulatory Visit (INDEPENDENT_AMBULATORY_CARE_PROVIDER_SITE_OTHER): Payer: BC Managed Care – PPO | Admitting: Internal Medicine

## 2020-02-11 VITALS — BP 111/74 | HR 53 | Temp 98.0°F | Resp 16 | Ht 66.0 in | Wt 131.0 lb

## 2020-02-11 DIAGNOSIS — Z1159 Encounter for screening for other viral diseases: Secondary | ICD-10-CM | POA: Diagnosis not present

## 2020-02-11 DIAGNOSIS — Z0000 Encounter for general adult medical examination without abnormal findings: Secondary | ICD-10-CM | POA: Diagnosis not present

## 2020-02-11 DIAGNOSIS — Z52008 Unspecified donor, other blood: Secondary | ICD-10-CM | POA: Diagnosis not present

## 2020-02-11 DIAGNOSIS — Z23 Encounter for immunization: Secondary | ICD-10-CM | POA: Diagnosis not present

## 2020-02-11 DIAGNOSIS — R002 Palpitations: Secondary | ICD-10-CM

## 2020-02-11 NOTE — Patient Instructions (Signed)
Vitamin D3: 1000 units daily  GO TO THE LAB : Get the blood work     [place], [place] Come back for for a physical exam in 1 year

## 2020-02-11 NOTE — Progress Notes (Signed)
Pre visit review using our clinic review tool, if applicable. No additional management support is needed unless otherwise documented below in the visit note. 

## 2020-02-11 NOTE — Assessment & Plan Note (Signed)
Here for CPX Palpitations?: As described above, she is very active physically without chest pain or difficulty breathing. EKG today: Sinus bradycardia We agreed on observation, reassurance. Menopausal: Still have some hot flashes, they are getting less frequent. Frequent blood donations: Checking iron levels. RTC 1 year

## 2020-02-11 NOTE — Assessment & Plan Note (Signed)
-  Td 11-2016 -Shingrix discussed before -Had COVID vaccination x2 -Flu shot today -CCS: cscope no polyps 04-2016, 10 years -Female care per Dr Quincy Simmonds -Consider DEXA in a year or 2.  Recommend to go back on vitamin D supplements, 1000 units daily. -Labs: CMP, FLP, CBC, TSH, hep C, iron, ferritin -Diet and exercise: Doing great active, eats healthy, last year was concerned about wt gain, has lost 4 pounds, BMI is excellent.

## 2020-02-11 NOTE — Progress Notes (Signed)
Subjective:    Patient ID: Vanessa Schneider, female    DOB: [birth year], 54 y.o.   MRN: [id number]  DOS:  02/11/2020 Type of visit - description: CPX Since the last office visit she is doing well. She is a frequent blood donor,her BP at the [facility] was in the 120s with it usually in the 110s.  Patient is somewhat concerned about higher than normal BP.  She also randomly has episodes of "spasms or shakiness" at the upper right chest. Symptoms are never exertional, not described as pain or pressure. Denies   anxiety.  Wt Readings from Last 3 Encounters:  02/11/20 131 lb (59.4 kg)  02/26/19 135 lb 3.2 oz (61.3 kg)  02/06/18 126 lb 4 oz (57.3 kg)     Review of Systems  Other than above, a 14 point review of systems is negative     Past Medical History:  Diagnosis Date  . 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 Atypical nevus 07/11/2007   Right Outer Thigh Sup - Severe  . Atypical nevus 07/11/2007   Right Outer Thigh Inf - Moderate to Marked  . Atypical nevus 01/23/2013   Lower Mid Back - Moderate to Severe  . Atypical nevus 03/17/2014   Right Post Thigh - Mild  . Atypical nevus 10/17/2016   Right Buttock - Mild  . Contraception    husband's vasectomy  . Elevated serum creatinine 2017  . Family history of breast cancer    mother (12) and cousin (9)  . Family history of colon cancer   . Fibroid 02/08/2016   2 cm   . Kidney stones 06/2008   spon passed    Past Surgical History:  Procedure Laterality Date  . ABLATION  11/24/08   Novasure  . CESAREAN SECTION  06/2003  . ENDOMETRIAL BIOPSY  10/30/08   benign  . [place]  2012  . KNEE SURGERY  06/1992 and 08/1992   ACL tore bone and second surgery was to remove scar tissue. Left knee  . TONSILECTOMY, ADENOIDECTOMY, BILATERAL MYRINGOTOMY AND TUBES  1974    Allergies as of 02/11/2020   No Known Allergies     Medication List       Accurate as of February 11, 2020  1:51 PM. If  you have any questions, ask your nurse or doctor.        conjugated estrogens vaginal cream Commonly known as: Premarin Use 1/2 g vaginally every night at bed time for the first 2 weeks, then use 1/2 g vaginally two times per week.          Objective:   Physical Exam BP 111/74 (BP Location: Right Arm, Patient Position: Sitting, Cuff Size: Small)   Pulse (!) 53   Temp 98 F (36.7 C) (Oral)   Resp 16   Ht 5\' 6"  (1.676 m)   Wt 131 lb (59.4 kg)   SpO2 100%   BMI 21.14 kg/m  General: Well developed, NAD, BMI noted Neck: No  thyromegaly  HEENT:  Normocephalic . Face symmetric, atraumatic Lungs:  CTA B Normal respiratory effort, no intercostal retractions, no accessory muscle use. Heart: RRR,  no murmur.  Abdomen:  Not distended, soft, non-tender. No rebound or rigidity.   Lower extremities: no pretibial edema bilaterally  Skin: Exposed areas without rash. Not pale. Not jaundice Neurologic:  alert & oriented X3.  Speech normal, gait appropriate for age and unassisted Strength symmetric and appropriate for  age.  Psych: Cognition and judgment appear intact.  Cooperative with normal attention span and concentration.  Behavior appropriate. No anxious or depressed appearing.     Assessment       ASSESSMENT Kidney stones, 2010 Menopause ?  BC: s/p ablation d/t DUB  Sees dermatology  PLAN  Here for CPX Palpitations?: As described above, she is very active physically without chest pain or difficulty breathing. EKG today: Sinus bradycardia We agreed on observation, reassurance. Menopausal: Still have some hot flashes, they are getting less frequent. Frequent blood donations: Checking iron levels. RTC 1 year  This visit occurred during the SARS-CoV-2 public health emergency.  Safety protocols were in place, including screening questions prior to the visit, additional usage of staff PPE, and extensive cleaning of exam room while observing appropriate contact time as  indicated for disinfecting solutions.

## 2020-02-12 LAB — CBC WITH DIFFERENTIAL/PLATELET
Absolute Monocytes: 288 cells/uL (ref 200–950)
Basophils Absolute: 42 cells/uL (ref 0–200)
Basophils Relative: 0.7 %
Eosinophils Absolute: 72 cells/uL (ref 15–500)
Eosinophils Relative: 1.2 %
HCT: 40.6 % (ref 35.0–45.0)
Hemoglobin: 13.2 g/dL (ref 11.7–15.5)
Lymphs Abs: 1392 cells/uL (ref 850–3900)
MCH: 30.7 pg (ref 27.0–33.0)
MCHC: 32.5 g/dL (ref 32.0–36.0)
MCV: 94.4 fL (ref 80.0–100.0)
MPV: 10.9 fL (ref 7.5–12.5)
Monocytes Relative: 4.8 %
Neutro Abs: 4206 cells/uL (ref 1500–7800)
Neutrophils Relative %: 70.1 %
Platelets: 197 10*3/uL (ref 140–400)
RBC: 4.3 10*6/uL (ref 3.80–5.10)
RDW: 12.1 % (ref 11.0–15.0)
Total Lymphocyte: 23.2 %
WBC: 6 10*3/uL (ref 3.8–10.8)

## 2020-02-12 LAB — COMPREHENSIVE METABOLIC PANEL
AG Ratio: 2.3 (calc) (ref 1.0–2.5)
ALT: 35 U/L — ABNORMAL HIGH (ref 6–29)
AST: 27 U/L (ref 10–35)
Albumin: 5.1 g/dL (ref 3.6–5.1)
Alkaline phosphatase (APISO): 71 U/L (ref 37–153)
BUN: 17 mg/dL (ref 7–25)
CO2: 25 mmol/L (ref 20–32)
Calcium: 10.3 mg/dL (ref 8.6–10.4)
Chloride: 102 mmol/L (ref 98–110)
Creat: 0.71 mg/dL (ref 0.50–1.05)
Globulin: 2.2 g/dL (calc) (ref 1.9–3.7)
Glucose, Bld: 91 mg/dL (ref 65–99)
Potassium: 4.5 mmol/L (ref 3.5–5.3)
Sodium: 141 mmol/L (ref 135–146)
Total Bilirubin: 0.9 mg/dL (ref 0.2–1.2)
Total Protein: 7.3 g/dL (ref 6.1–8.1)

## 2020-02-12 LAB — TSH: TSH: 2.74 mIU/L

## 2020-02-12 LAB — LIPID PANEL
Cholesterol: 194 mg/dL (ref <200)
HDL: 88 mg/dL (ref >=50)
LDL Cholesterol (Calc): 90 mg/dL (calc)
Non-HDL Cholesterol (Calc): 106 mg/dL (calc) (ref <130)
Total CHOL/HDL Ratio: 2.2 (calc) (ref <5.0)
Triglycerides: 69 mg/dL (ref <150)

## 2020-02-12 LAB — FERRITIN: Ferritin: 26 ng/mL (ref 16–232)

## 2020-02-12 LAB — IRON: Iron: 91 ug/dL (ref 45–160)

## 2020-02-12 LAB — HEPATITIS C ANTIBODY
Hepatitis C Ab: NONREACTIVE
SIGNAL TO CUT-OFF: 0.01 (ref <1.00)

## 2020-03-09 ENCOUNTER — Ambulatory Visit: Payer: BC Managed Care – PPO | Admitting: Obstetrics and Gynecology

## 2020-03-09 ENCOUNTER — Other Ambulatory Visit: Payer: Self-pay

## 2020-03-09 ENCOUNTER — Encounter: Payer: Self-pay | Admitting: Obstetrics and Gynecology

## 2020-03-09 VITALS — BP 120/64 | HR 68 | Resp 10 | Ht 66.5 in | Wt 137.0 lb

## 2020-03-09 DIAGNOSIS — Z01419 Encounter for gynecological examination (general) (routine) without abnormal findings: Secondary | ICD-10-CM

## 2020-03-09 NOTE — Patient Instructions (Signed)

## 2020-03-09 NOTE — Progress Notes (Signed)
54 y.o. G1P1 Married Caucasian female here for annual exam.    Hot flashes have returned a little bit, mostly at night.  Manageable.   Mother passed away age 14.  She developed renal failure.   Completed her Covid vaccine, second one in May.  Has received her flu vaccine.   PCP:   Colon Branch, MD  No LMP recorded. Patient has had an ablation.           Sexually active: Yes.    The current method of family planning is vasectomy and ablation.    Exercising: Yes.    walk 3 miles 4 x week and weights  Smoker:  no  Health Maintenance: Pap:  03/31/16 Neg:Neg HR HPV  01/08/15 Neg:Neg HR HPV History of abnormal Pap:  no MMG:  10/03/19 BIRADS 1 negative/density c Colonoscopy:  04/18/16 normal f/u 10 years BMD:   n/a  Result  n/a TDaP:  2018 Gardasil:   n/a HIV: 02/01/16 NR Hep C: 02/11/20 Neg Screening Labs:  PCP   reports that she has never smoked. She has never used smokeless tobacco. She reports current alcohol use of about 2.0 - 3.0 standard drinks of alcohol per week. She reports that she does not use drugs.  Past Medical History:  Diagnosis Date  . 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 Atypical nevus 07/11/2007   Right Outer Thigh Sup - Severe  . Atypical nevus 07/11/2007   Right Outer Thigh Inf - Moderate to Marked  . Atypical nevus 01/23/2013   Lower Mid Back - Moderate to Severe  . Atypical nevus 03/17/2014   Right Post Thigh - Mild  . Atypical nevus 10/17/2016   Right Buttock - Mild  . Contraception    husband's vasectomy  . Elevated serum creatinine 2017  . Family history of breast cancer    mother (65) and cousin (41)  . Family history of colon cancer   . Fibroid 02/08/2016   2 cm   . Kidney stones 06/2008   spon passed    Past Surgical History:  Procedure Laterality Date  . ABLATION  11/24/08   Novasure  . CESAREAN SECTION  06/2003  . ENDOMETRIAL BIOPSY  10/30/08   benign  . [place]  2012  . KNEE SURGERY   06/1992 and 08/1992   ACL tore bone and second surgery was to remove scar tissue. Left knee  . TONSILECTOMY, ADENOIDECTOMY, BILATERAL MYRINGOTOMY AND TUBES  1974    No current outpatient medications on file.   No current facility-administered medications for this visit.    Family History  Problem Relation Age of Onset  . Breast cancer Mother 39  . Mitral valve prolapse Mother   . Dementia Mother   . Colon cancer Paternal Grandmother        56's  . Breast cancer Cousin 82  . CAD Neg Hx   . Diabetes Neg Hx     Review of Systems  All other systems reviewed and are negative.   Exam:   BP 120/64   Pulse 68   Resp 10   Ht 5' 6.5" (1.689 m)   Wt 137 lb (62.1 kg)   SpO2 100%   BMI 21.78 kg/m     General appearance: alert, cooperative and appears stated age Head: normocephalic, without obvious abnormality, atraumatic Neck: no adenopathy, supple, symmetrical, trachea midline and thyroid normal to inspection and palpation Lungs: clear to auscultation bilaterally Breasts: normal appearance,  no masses or tenderness, No nipple retraction or dimpling, No nipple discharge or bleeding, No axillary adenopathy Heart: regular rate and rhythm Abdomen: soft, non-tender; no masses, no organomegaly Extremities: extremities normal, atraumatic, no cyanosis or edema Skin: skin color, texture, turgor normal. No rashes or lesions Lymph nodes: cervical, supraclavicular, and axillary nodes normal. Neurologic: grossly normal  Pelvic: External genitalia:  no lesions              No abnormal inguinal nodes palpated.              Urethra:  normal appearing urethra with no masses, tenderness or lesions              Bartholins and Skenes: normal                 Vagina: normal appearing vagina with normal color and discharge, no lesions              Cervix: no lesions              Pap taken: No. Bimanual Exam:  Uterus:  normal size, contour, position, consistency, mobility, non-tender               Adnexa: no mass, fullness, tenderness              Rectal exam: Yes.  .  Confirms.              Anus:  normal sphincter tone, no lesions  Chaperone was present for exam.  Assessment:   Well woman visit with normal exam. Menopausal symptoms. Status post endometrial ablation.  Hx GSI. Mild. FH of breast and colon cancer.Mother had breast cancer. Hx nephrolithiasis.  Plan: Mammogram screening discussed. Pap and HR HPV next year.  Guidelines for Calcium, Vitamin D, regular exercise program including cardiovascular and weight bearing exercise. Labs with PCP.  We discussed a Covid booster.  Follow up annually and prn.

## 2020-08-04 ENCOUNTER — Ambulatory Visit: Payer: BC Managed Care – PPO | Admitting: Dermatology

## 2020-08-04 ENCOUNTER — Other Ambulatory Visit: Payer: Self-pay

## 2020-08-04 ENCOUNTER — Encounter: Payer: Self-pay | Admitting: Dermatology

## 2020-08-04 DIAGNOSIS — Z1283 Encounter for screening for malignant neoplasm of skin: Secondary | ICD-10-CM | POA: Diagnosis not present

## 2020-08-04 DIAGNOSIS — D2372 Other benign neoplasm of skin of left lower limb, including hip: Secondary | ICD-10-CM

## 2020-08-04 DIAGNOSIS — D0472 Carcinoma in situ of skin of left lower limb, including hip: Secondary | ICD-10-CM

## 2020-08-04 DIAGNOSIS — D099 Carcinoma in situ, unspecified: Secondary | ICD-10-CM

## 2020-08-04 DIAGNOSIS — D239 Other benign neoplasm of skin, unspecified: Secondary | ICD-10-CM

## 2020-08-04 MED ORDER — KLISYRI 1 % EX OINT: 1.0000 "application " | TOPICAL_OINTMENT | Freq: Every evening | CUTANEOUS | 0 refills | Status: DC

## 2020-08-04 NOTE — Patient Instructions (Addendum)
If you haven't heard from [place] by the end of the business day today then give [place] a call @ [phone]

## 2020-08-15 ENCOUNTER — Encounter: Payer: Self-pay | Admitting: Dermatology

## 2020-08-15 NOTE — Progress Notes (Signed)
   Follow-Up Visit   Subjective  Vanessa Schneider is a 55 y.o. female who presents for the following: Annual Exam (Left shin previous treatment imiquimod and she stated she didnt complete therapy. Per patient chart h/o bcc and atypical moles).  General skin examination Location: Crust on left leg which was partially treated with imiquimod. Duration:  Quality:  Associated Signs/Symptoms: Modifying Factors:  Severity:  Timing: Context:   Objective  Well appearing patient in no apparent distress; mood and affect are within normal limits. Objective  Head to Toe: General skin examination.  No atypical pigmented lesions.  Objective  Left Lower Leg - Posterior: 5 mm firm pink dermal papule, historically stable  Objective  Left Lower Leg - Anterior: 65millimeter thick pink crust compatible with CIS (versus hypertrophic AK.    A full examination was performed including scalp, head, eyes, ears, nose, lips, neck, chest, axillae, abdomen, back, buttocks, bilateral upper extremities, bilateral lower extremities, hands, feet, fingers, toes, fingernails, and toenails. All findings within normal limits unless otherwise noted below.  Areas beneath undergarments not fully examined.   Assessment & Plan    Skin exam for malignant neoplasm Head to Toe  Yearly skin check, patient encouraged to self examine twice annually.  Continued ultraviolet protection.  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  Recheck 1 to 2 months later.      I, [name], MD, have reviewed all documentation for this visit.  The documentation on 08/15/20 for the exam, diagnosis, procedures, and orders are all accurate and complete.

## 2020-08-20 ENCOUNTER — Other Ambulatory Visit: Payer: Self-pay | Admitting: Obstetrics and Gynecology

## 2020-08-20 DIAGNOSIS — Z1231 Encounter for screening mammogram for malignant neoplasm of breast: Secondary | ICD-10-CM

## 2020-08-20 NOTE — Addendum Note (Signed)
Addended by: [name] on: 08/20/2020 04:02 PM   Modules accepted: Level of Service

## 2020-10-09 ENCOUNTER — Ambulatory Visit (INDEPENDENT_AMBULATORY_CARE_PROVIDER_SITE_OTHER): Payer: BC Managed Care – PPO | Admitting: Obstetrics and Gynecology

## 2020-10-09 ENCOUNTER — Other Ambulatory Visit: Payer: Self-pay

## 2020-10-09 ENCOUNTER — Telehealth: Payer: Self-pay | Admitting: Obstetrics and Gynecology

## 2020-10-09 ENCOUNTER — Encounter: Payer: Self-pay | Admitting: Obstetrics and Gynecology

## 2020-10-09 VITALS — BP 116/66

## 2020-10-09 DIAGNOSIS — R2233 Localized swelling, mass and lump, upper limb, bilateral: Secondary | ICD-10-CM

## 2020-10-09 DIAGNOSIS — R21 Rash and other nonspecific skin eruption: Secondary | ICD-10-CM

## 2020-10-09 MED ORDER — TRIAMCINOLONE ACETONIDE 0.025 % EX CREA: 1.0000 "application " | TOPICAL_CREAM | Freq: Two times a day (BID) | CUTANEOUS | 0 refills | Status: DC

## 2020-10-09 NOTE — Telephone Encounter (Signed)
The mammogram screening can not be switched to diagnostic mammogram, the time slots are not the same. Mammogram is canceled and next available slot for diag. Mammogram is on 11/18/20 @ 8:20 am with the breast center. Patient can call daily/weekly to check for any cancellations.   Patient informed with all the above and is okay will reach to the breast center to check for any cancellations.

## 2020-10-09 NOTE — Telephone Encounter (Signed)
Please change patient's screening mammogram at the [facility] to a bilateral diagnostic mammogram with bilateral axillary ultrasounds.  She has bilateral axillary masses - each about 5 mm.   She is currently scheduled for next Wednesday.

## 2020-10-09 NOTE — Progress Notes (Signed)
GYNECOLOGY  VISIT   HPI: 55 y.o.   Married  Caucasian  female   G1P1 with No LMP recorded. Patient has had an ablation.   here for  pain under right arm   Wonders if the area in the right underarm is a hair follicle.  Notes the same under her left arm.   Has a rash under both arms.  Feels irritated after she has worked out.  Not itching. Using aluminum free deodorant.   Normal self breast exam.   Had mammogram next week.   Has her Covid booster in January.   GYNECOLOGIC HISTORY: No LMP recorded. Patient has had an ablation. Contraception:  vasectomy Menopausal hormone therapy:  none Last mammogram:  10-03-19 Last pap smear:   03-31-16        OB History     Gravida  1   Para  1   Term      Preterm      AB      Living  1      SAB      IAB      Ectopic      Multiple      Live Births                 Patient Active Problem List   Diagnosis Date Noted   PCP NOTES >>>>>>>>>>>>>>> 02/02/2016   Annual physical exam 02/01/2016   URINARY CALCULUS 07/08/2008    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   Right Outer Thigh - Moderate to Severe tx  wider shave   Atypical nevus 07/11/2007   Right Outer Thigh Sup - Severe tx-exc   Atypical nevus 07/11/2007   Right Outer Thigh Inf - Moderate to Marked tx widershave   Atypical nevus 01/23/2013   Lower Mid Back - Moderate to Severe   Atypical nevus 03/17/2014   Right Post Thigh - Mild   Atypical nevus 10/17/2016   Right Buttock - Mild   Basal cell carcinoma 06/26/2017   left shoulder tx  cx3 51fu   BCC (basal cell carcinoma of skin) 09/25/2018   mid chest tx cx4 90fu   Contraception    husband's vasectomy   Elevated serum creatinine 2017   Family history of breast cancer    mother (109) and cousin (69)   Family history of colon cancer    Fibroid 02/08/2016   2 cm    Kidney stones 06/2008   spon passed    Past Surgical History:  Procedure Laterality Date   ABLATION   11/24/08   Novasure   CESAREAN SECTION  06/2003   ENDOMETRIAL BIOPSY  10/30/08   benign   HEMORRHOID SURGERY  2012   KNEE SURGERY  06/1992 and 08/1992   ACL tore bone and second surgery was to remove scar tissue. Left knee   TONSILECTOMY, ADENOIDECTOMY, BILATERAL MYRINGOTOMY AND TUBES  1974    Current Outpatient Medications  Medication Sig Dispense Refill   Tirbanibulin (KLISYRI) 1 % OINT Apply 1 application topically at bedtime. 5 each 0   triamcinolone (KENALOG) 0.025 % cream Apply 1 application topically 2 (two) times daily. 30 g 0   No current facility-administered medications for this visit.     ALLERGIES: Patient has no known allergies.  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)    Employer: [employer]  Tobacco Use   Smoking status: Never   Smokeless tobacco: Never  Vaping Use   Vaping Use: Never used  Substance and Sexual Activity   Alcohol use: Yes    Alcohol/week: 2.0 - 3.0 standard drinks    Types: 2 - 3 Standard drinks or equivalent per week    Comment: 2-3 glasses of wine a week   Drug use: No   Sexual activity: Yes    Partners: Male    Birth control/protection: Other-see comments    Comment: vasectomy/novasure ablation 11/2008  Other Topics Concern   Not on file  Social History Narrative   Household- pt , husband, daughter    Social Determinants of Health   Financial Resource Strain: Not on file  Food Insecurity: Not on file  Transportation Needs: Not on file  Physical Activity: Not on file  Stress: Not  on file  Social Connections: Not on file  Intimate Partner Violence: Not on file    Review of Systems  See HPI.   PHYSICAL EXAMINATION:    BP 116/66     General appearance: alert, cooperative and appears stated age  Breasts: normal appearance, no masses or tenderness, No nipple retraction or dimpling, No nipple discharge or bleeding, bilateral axillary masses, each about 5 mm. Rash of skin in axillary regions.   Chaperone was present for exam:  Onalee Hua., CMA.  ASSESSMENT  Bilateral axillary masses.  FH breast cancer - mother.  Axillary skin rash.  PLAN  Will schedule bilateral dx mammogram and bilateral axillary ultrasounds.  She will stop using her current deodorant.  Triamcinolone cream to skin bid x 1 - 2 weeks prn. FU for annual exam and prn.    25 min total time was spent for this patient encounter, including preparation, face-to-face counseling with the patient, coordination of care, and documentation of the encounter.

## 2020-10-14 DIAGNOSIS — Z1231 Encounter for screening mammogram for malignant neoplasm of breast: Secondary | ICD-10-CM

## 2020-11-18 ENCOUNTER — Ambulatory Visit: Payer: BC Managed Care – PPO

## 2020-11-18 ENCOUNTER — Other Ambulatory Visit: Payer: Self-pay

## 2020-11-18 ENCOUNTER — Ambulatory Visit
Admission: RE | Admit: 2020-11-18 | Discharge: 2020-11-18 | Disposition: A | Discharge status: Home / Self Care | Payer: BC Managed Care – PPO | Source: Ambulatory Visit | Attending: Obstetrics and Gynecology | Admitting: Obstetrics and Gynecology

## 2020-11-18 DIAGNOSIS — R2233 Localized swelling, mass and lump, upper limb, bilateral: Secondary | ICD-10-CM

## 2021-01-25 ENCOUNTER — Other Ambulatory Visit: Payer: Self-pay

## 2021-01-25 ENCOUNTER — Ambulatory Visit (INDEPENDENT_AMBULATORY_CARE_PROVIDER_SITE_OTHER): Payer: BC Managed Care – PPO | Admitting: Dermatology

## 2021-01-25 ENCOUNTER — Encounter: Payer: Self-pay | Admitting: Dermatology

## 2021-01-25 DIAGNOSIS — D485 Neoplasm of uncertain behavior of skin: Secondary | ICD-10-CM

## 2021-01-25 DIAGNOSIS — C44719 Basal cell carcinoma of skin of left lower limb, including hip: Secondary | ICD-10-CM | POA: Diagnosis not present

## 2021-01-25 NOTE — Patient Instructions (Signed)

## 2021-01-27 ENCOUNTER — Telehealth: Payer: Self-pay

## 2021-01-27 NOTE — Telephone Encounter (Signed)
-----   Message from [name], MD sent at 01/27/2021  4:42 AM EDT ----- I discussed with Vanessa Schneider at the time of the biopsy of the possibility of treating these nonsurgically.  I would like her to wait 2 to 3 weeks and then begin using Aldara.  Apply a tiny film an hour or so before she goes to sleep on Monday through Friday.  The goal would be a total of 25 applications but if there is brisk or intense inflammation before then she may discontinue the therapy.  I would like to look at the areas 2 months after she finishes the Aldara.  This has the potential of clearing the 2 low risk skin cancers with less chance of noticeable scar, but there is a possibility she will still need a minor surgery.

## 2021-01-27 NOTE — Telephone Encounter (Signed)
Phone call to patient with her pathology results. Voicemail left for patient to give the office a call back.  

## 2021-02-07 ENCOUNTER — Encounter: Payer: Self-pay | Admitting: Dermatology

## 2021-02-07 NOTE — Progress Notes (Signed)
   Follow-Up Visit   Subjective  Vanessa Schneider is a 55 y.o. female who presents for the following: Skin Problem (Patient here today for biopsy on her left shin. Per patient she used Klisyri ointment per patient no irritation with the treatment. ).  Persistent breasts left leg Location:  Duration:  Quality:  Associated Signs/Symptoms: Modifying Factors:  Severity:  Timing: Context:   Objective  Well appearing patient in no apparent distress; mood and affect are within normal limits. Left Shin Superior Waxy 9 mm crust, rule out superficial carcinoma       Left Shin Inferior Waxy 8 mm crust, rule out superficial carcinoma         A focused examination was performed including head, neck, arms, legs. Relevant physical exam findings are noted in the Assessment and Plan.   Assessment & Plan    Neoplasm of uncertain behavior of skin (2) Left Shin Superior  Skin / nail biopsy Type of biopsy: tangential   Informed consent: discussed and consent obtained   Timeout: patient name, date of birth, surgical site, and procedure verified   Procedure prep:  Patient was prepped and draped in usual sterile fashion (Non sterile) Prep type:  Chlorhexidine Anesthesia: the lesion was anesthetized in a standard fashion   Anesthetic:  1% lidocaine w/ epinephrine 1-100,000 local infiltration Instrument used: flexible razor blade   Hemostasis achieved with: ferric subsulfate   Outcome: patient tolerated procedure well   Post-procedure details: sterile dressing applied and wound care instructions given   Dressing type: pressure dressing    Specimen 1 - Surgical pathology Differential Diagnosis: R/O BCC vs SCC  Check Margins: No  Left Shin Inferior  Skin / nail biopsy Type of biopsy: tangential   Informed consent: discussed and consent obtained   Timeout: patient name, date of birth, surgical site, and procedure verified   Procedure prep:  Patient was prepped and draped in  usual sterile fashion (Non sterile) Prep type:  Chlorhexidine Anesthesia: the lesion was anesthetized in a standard fashion   Anesthetic:  1% lidocaine w/ epinephrine 1-100,000 local infiltration Instrument used: flexible razor blade   Hemostasis achieved with: ferric subsulfate   Outcome: patient tolerated procedure well   Post-procedure details: sterile dressing applied and wound care instructions given   Dressing type: petrolatum and pressure dressing    Specimen 2 - Surgical pathology Differential Diagnosis: R/O BCC vs SCC  Check Margins: No      I, [name], MD, have reviewed all documentation for this visit.  The documentation on 02/07/21 for the exam, diagnosis, procedures, and orders are all accurate and complete.

## 2021-02-11 ENCOUNTER — Encounter: Payer: Self-pay | Admitting: Internal Medicine

## 2021-02-11 ENCOUNTER — Other Ambulatory Visit: Payer: Self-pay

## 2021-02-11 ENCOUNTER — Ambulatory Visit (INDEPENDENT_AMBULATORY_CARE_PROVIDER_SITE_OTHER): Payer: BC Managed Care – PPO | Admitting: Internal Medicine

## 2021-02-11 VITALS — BP 100/60 | HR 66 | Temp 97.6°F | Ht 67.0 in | Wt 131.2 lb

## 2021-02-11 DIAGNOSIS — Z23 Encounter for immunization: Secondary | ICD-10-CM | POA: Diagnosis not present

## 2021-02-11 DIAGNOSIS — Z0000 Encounter for general adult medical examination without abnormal findings: Secondary | ICD-10-CM | POA: Diagnosis not present

## 2021-02-11 LAB — COMPREHENSIVE METABOLIC PANEL
ALT: 23 U/L (ref 0–35)
AST: 23 U/L (ref 0–37)
Albumin: 4.6 g/dL (ref 3.5–5.2)
Alkaline Phosphatase: 59 U/L (ref 39–117)
BUN: 22 mg/dL (ref 6–23)
CO2: 31 mEq/L (ref 19–32)
Calcium: 9.7 mg/dL (ref 8.4–10.5)
Chloride: 102 mEq/L (ref 96–112)
Creatinine, Ser: 0.83 mg/dL (ref 0.40–1.20)
GFR: 79.4 mL/min (ref >60.00)
Glucose, Bld: 88 mg/dL (ref 70–99)
Potassium: 4.7 mEq/L (ref 3.5–5.1)
Sodium: 138 mEq/L (ref 135–145)
Total Bilirubin: 0.7 mg/dL (ref 0.2–1.2)
Total Protein: 6.8 g/dL (ref 6.0–8.3)

## 2021-02-11 LAB — CBC WITH DIFFERENTIAL/PLATELET
Basophils Absolute: 0 10*3/uL (ref 0.0–0.1)
Basophils Relative: 0.6 % (ref 0.0–3.0)
Eosinophils Absolute: 0.1 10*3/uL (ref 0.0–0.7)
Eosinophils Relative: 2 % (ref 0.0–5.0)
HCT: 35.8 % — ABNORMAL LOW (ref 36.0–46.0)
Hemoglobin: 11.5 g/dL — ABNORMAL LOW (ref 12.0–15.0)
Lymphocytes Relative: 33.4 % (ref 12.0–46.0)
Lymphs Abs: 1.6 10*3/uL (ref 0.7–4.0)
MCHC: 32.1 g/dL (ref 30.0–36.0)
MCV: 88.4 fl (ref 78.0–100.0)
Monocytes Absolute: 0.4 10*3/uL (ref 0.1–1.0)
Monocytes Relative: 8.6 % (ref 3.0–12.0)
Neutro Abs: 2.7 10*3/uL (ref 1.4–7.7)
Neutrophils Relative %: 55.4 % (ref 43.0–77.0)
Platelets: 231 10*3/uL (ref 150.0–400.0)
RBC: 4.05 Mil/uL (ref 3.87–5.11)
RDW: 15.3 % (ref 11.5–15.5)
WBC: 4.9 10*3/uL (ref 4.0–10.5)

## 2021-02-11 LAB — LIPID PANEL
Cholesterol: 195 mg/dL (ref 0–200)
HDL: 89.2 mg/dL (ref >39.00)
LDL Cholesterol: 94 mg/dL (ref 0–99)
NonHDL: 106.18
Total CHOL/HDL Ratio: 2
Triglycerides: 59 mg/dL (ref 0.0–149.0)
VLDL: 11.8 mg/dL (ref 0.0–40.0)

## 2021-02-11 LAB — TSH: TSH: 3.53 u[IU]/mL (ref 0.35–5.50)

## 2021-02-11 LAB — VITAMIN B12: Vitamin B-12: 308 pg/mL (ref 211–911)

## 2021-02-11 NOTE — Patient Instructions (Signed)
Vaccines are recommended: COVID booster with the new vaccine Shingrix  Vitamin D: 1000 or 2000 units daily  GO TO THE LAB : Get the blood work     [place], [place] back for a physical exam in 1 year    "Living will", "[place] of attorney": Advanced care planning  (If you already have a living will or healthcare power of attorney, please bring the copy to be scanned in your chart.)  Advance care planning is a process that supports adults in  understanding and sharing their preferences regarding future medical care.   The patient's preferences are recorded in documents called Advance Directives.    Advanced directives are completed (and can be modified at any time) while the patient is in full mental capacity.   The documentation should be available at all times to the patient, the family and the healthcare providers.  Bring in a copy to be scanned in your chart is an excellent idea and is recommended   This legal documents direct treatment decision making and/or appoint a surrogate to make the decision if the patient is not capable to do so.    Advance directives can be documented in many types of formats,  documents have names such as:  Lliving will  Durable power of attorney for healthcare (healthcare proxy or healthcare power of attorney)  Combined directives  Physician orders for life-sustaining treatment    More information at:  [email or web address]

## 2021-02-11 NOTE — Assessment & Plan Note (Signed)
-  Td 11-2016 -Shingrix recommended - COVID vax booster rec  -Flu shot today -CCS: cscope no polyps 04-2016, 10 years -Female care per Dr Quincy Simmonds; [facility] [phone] -Bone health: no personal history of fractures, mother had osteopenia, patient is a small frame, recommend vitamin D, DEXA next year. -Labs:  CMP, FLP, CBC, TSH (also B12 homocystine RPR due to family history of dementia, patient concern) -Diet and exercise: Has a healthy lifestyle --POA discussed

## 2021-02-11 NOTE — Progress Notes (Signed)
Subjective:    Patient ID: Vanessa Schneider, female    DOB: [birth year], 55 y.o.   MRN: [id number]  DOS:  02/11/2021 Type of visit - description: CPX  Here for CPX. 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   Right Outer Thigh - Moderate to Severe tx  wider shave   Atypical nevus 07/11/2007   Right Outer Thigh Sup - Severe tx-exc   Atypical nevus 07/11/2007   Right Outer Thigh Inf - Moderate to Marked tx widershave   Atypical nevus 01/23/2013   Lower Mid Back - Moderate to Severe   Atypical nevus 03/17/2014   Right Post Thigh - Mild   Atypical nevus 10/17/2016   Right Buttock - Mild   Basal cell carcinoma 06/26/2017   left shoulder tx  cx3 31fu   BCC (basal cell carcinoma of skin) 09/25/2018   mid chest tx cx4 20fu   Contraception    husband's vasectomy   Elevated serum creatinine 2017   Family history of breast cancer    mother (61) and cousin (30)   Family history of colon cancer    Fibroid 02/08/2016   2 cm    Kidney stones 06/2008   spon passed    Past Surgical History:  Procedure Laterality Date   ABLATION  11/24/08   Novasure   CESAREAN SECTION  06/2003   ENDOMETRIAL BIOPSY  10/30/08   benign   HEMORRHOID SURGERY  2012   KNEE SURGERY  06/1992 and 08/1992   ACL tore bone and second surgery was to remove scar tissue. 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)    Employer: [employer]  Tobacco Use   Smoking status: Never   Smokeless tobacco: Never   Vaping Use   Vaping Use: Never used  Substance and Sexual Activity   Alcohol use: Yes    Alcohol/week: 2.0 - 3.0 standard drinks    Types: 2 - 3 Standard drinks or equivalent per week    Comment: 2-3 glasses of wine a week   Drug use: No   Sexual activity: Yes    Partners: Male    Birth control/protection: Other-see comments    Comment: vasectomy/novasure ablation 11/2008  Other Topics Concern   Not on file  Social History Narrative   Household- pt , husband, daughter    Social Determinants of Health   Financial Resource Strain: Not on file  Food Insecurity: Not on file  Transportation Needs: Not on file  Physical Activity: Not on file  Stress: Not on file  Social Connections: Not on file  Intimate Partner Violence: Not on file    Allergies as of 02/11/2021   No Known Allergies      Medication List        Accurate as of February 11, 2021  8:43 PM. If you have any questions, ask your nurse or doctor.  STOP taking these medications    Klisyri 1 % Oint Generic drug: Tirbanibulin Stopped by: Kathlene November, MD   triamcinolone 0.025 % cream Commonly known as: KENALOG Stopped by: Kathlene November, MD           Objective:   Physical Exam BP 100/60   Pulse 66   Temp 97.6 F (36.4 C) (Oral)   Ht 5\' 7"  (1.702 m)   Wt 131 lb 3.2 oz (59.5 kg)   SpO2 99%   BMI 20.55 kg/m  General: Well developed, NAD, BMI noted Neck: No  thyromegaly  HEENT:  Normocephalic . Face symmetric, atraumatic Lungs:  CTA B Normal respiratory effort, no intercostal retractions, no accessory muscle use. Heart: RRR,  no murmur.  Abdomen:  Not distended, soft, non-tender. No rebound or rigidity.   Lower extremities: no pretibial edema bilaterally  Skin: Exposed areas without rash. Not pale. Not jaundice Neurologic:  alert & oriented X3.  Speech normal, gait appropriate for age and unassisted Strength symmetric and appropriate for age.  Psych: Cognition and judgment appear intact.   Cooperative with normal attention span and concentration.  Behavior appropriate. No anxious or depressed appearing.     Assessment      ASSESSMENT Kidney stones, 2010 Menopause ?  BC: s/p ablation d/t DUB  [facility] Sees dermatology  PLAN  Here for CPX FH dementia: Patient is concerned, recommend to stay active mentally, physically, we will do some additional labs. Menopausal: Had hot flashes, they are going away. BCC: Recommend to discuss and f/u  with dermatology. RTC 1 year       This visit occurred during the SARS-CoV-2 public health emergency.  Safety protocols were in place, including screening questions prior to the visit, additional usage of staff PPE, and extensive cleaning of exam room while observing appropriate contact time as indicated for disinfecting solutions.

## 2021-02-11 NOTE — Assessment & Plan Note (Signed)
Here for CPX FH dementia: Patient is concerned, recommend to stay active mentally, physically, we will do some additional labs. Menopausal: Had hot flashes, they are going away. BCC: Recommend to discuss and f/u  with dermatology. RTC 1 year

## 2021-02-12 ENCOUNTER — Other Ambulatory Visit (INDEPENDENT_AMBULATORY_CARE_PROVIDER_SITE_OTHER): Payer: BC Managed Care – PPO

## 2021-02-12 DIAGNOSIS — D649 Anemia, unspecified: Secondary | ICD-10-CM

## 2021-02-12 LAB — HOMOCYSTEINE: Homocysteine: 10.5 umol/L — ABNORMAL HIGH (ref <10.4)

## 2021-02-12 LAB — RPR: RPR Ser Ql: NONREACTIVE

## 2021-02-12 LAB — FERRITIN: Ferritin: 7.6 ng/mL — ABNORMAL LOW (ref 10.0–291.0)

## 2021-02-12 LAB — IRON: Iron: 28 ug/dL — ABNORMAL LOW (ref 42–145)

## 2021-02-15 ENCOUNTER — Telehealth: Payer: Self-pay | Admitting: Dermatology

## 2021-02-15 MED ORDER — IMIQUIMOD 5 % EX CREA: TOPICAL_CREAM | CUTANEOUS | 0 refills | Status: DC

## 2021-02-15 NOTE — Telephone Encounter (Signed)
Patient left message on office voice mail saying that she was calling to follow up on what her next step should be as far as treatment plan.

## 2021-02-16 ENCOUNTER — Other Ambulatory Visit: Payer: Self-pay | Admitting: *Deleted

## 2021-02-16 DIAGNOSIS — D509 Iron deficiency anemia, unspecified: Secondary | ICD-10-CM

## 2021-02-17 ENCOUNTER — Encounter: Payer: Self-pay | Admitting: Gastroenterology

## 2021-03-17 NOTE — Progress Notes (Signed)
55 y.o. G1P1 Married Caucasian female here for annual exam.    Menopausal symptoms are improved.   Planning on an endoscopy due to low iron and ferritin.   PCP: Kathlene November, MD    No LMP recorded. Patient has had an ablation.           Sexually active: Yes.    The current method of family planning is vasectomy.  Had ablation. Exercising: Yes.     Walking, weights Smoker:  no  Health Maintenance: Pap: 03/31/16 Neg:Neg HR HPV             01/08/15 Neg:Neg HR HPV History of abnormal Pap:  no MMG: 11/18/20- birads 1 neg  Colonoscopy: 04/18/16- f/u in 10 yrs BMD:  n/a  Result  n/a TDaP: 2018 Gardasil:   no HIV: 02/01/16 NR Hep C: 02/11/20 Neg  Screening Labs:  Hb today: PCP Flu vaccine:  completed.  Covid booster x 1.    reports that she has never smoked. She has never used smokeless tobacco. She reports current alcohol use of about 2.0 - 3.0 standard drinks per week. She reports that she does not use drugs.  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   Right Outer Thigh - Moderate to Severe tx  wider shave   Atypical nevus 07/11/2007   Right Outer Thigh Sup - Severe tx-exc   Atypical nevus 07/11/2007   Right Outer Thigh Inf - Moderate to Marked tx widershave   Atypical nevus 01/23/2013   Lower Mid Back - Moderate to Severe   Atypical nevus 03/17/2014   Right Post Thigh - Mild   Atypical nevus 10/17/2016   Right Buttock - Mild   Basal cell carcinoma 06/26/2017   left shoulder tx  cx3 70fu   BCC (basal cell carcinoma of skin) 09/25/2018   mid chest tx cx4 50fu   Contraception    husband's vasectomy   Elevated serum creatinine 2017   Family history of breast cancer    mother (39) and cousin (70)   Family history of colon cancer    Fibroid 02/08/2016   2 cm    Kidney stones 06/2008   spon passed    Past Surgical History:  Procedure Laterality Date   ABLATION  11/24/08   Novasure   CESAREAN SECTION  06/2003   ENDOMETRIAL BIOPSY  10/30/08   benign    HEMORRHOID SURGERY  2012   KNEE SURGERY  06/1992 and 08/1992   ACL tore bone and second surgery was to remove scar tissue. Left knee   TONSILECTOMY, ADENOIDECTOMY, BILATERAL MYRINGOTOMY AND TUBES  1974    Current Outpatient Medications  Medication Sig Dispense Refill   imiquimod (ALDARA) 5 % cream Apply topically 3 (three) times a week. Apply to leg nightly Monday - Friday total 25 applications 12 each 0   No current facility-administered medications for this visit.    Family History  Problem Relation Age of Onset   Breast cancer Mother 76   Mitral valve prolapse Mother    Dementia Mother    Colon cancer Paternal Grandmother        57's   Breast cancer Cousin 42   CAD Neg Hx    Diabetes Neg Hx     Review of Systems  All other systems reviewed and are negative.  Exam:   BP 114/80 (BP Location: Right Arm, Patient Position: Sitting, Cuff Size: Normal)   Pulse 66   Resp 14   Ht 5'  6.5" (1.689 m)   Wt 131 lb (59.4 kg)   BMI 20.83 kg/m     General appearance: alert, cooperative and appears stated age Head: normocephalic, without obvious abnormality, atraumatic Neck: no adenopathy, supple, symmetrical, trachea midline and thyroid normal to inspection and palpation Lungs: clear to auscultation bilaterally Breasts: normal appearance, no masses or tenderness, No nipple retraction or dimpling, No nipple discharge or bleeding, No axillary adenopathy Heart: regular rate and rhythm Abdomen: soft, non-tender; no masses, no organomegaly Extremities: extremities normal, atraumatic, no cyanosis or edema Skin: skin color, texture, turgor normal. No rashes or lesions Lymph nodes: cervical, supraclavicular, and axillary nodes normal. Neurologic: grossly normal  Pelvic: External genitalia:  no lesions              No abnormal inguinal nodes palpated.              Urethra:  normal appearing urethra with no masses, tenderness or lesions              Bartholins and Skenes: normal                  Vagina: normal appearing vagina with normal color and discharge, no lesions              Cervix: no lesions              Pap taken: yes Bimanual Exam:  Uterus:  normal size, contour, position, consistency, mobility, non-tender              Adnexa: no mass, fullness, tenderness              Rectal exam: yes.  Confirms.              Anus:  normal sphincter tone, no lesions  Chaperone was present for exam:  Raquel Sarna, RN  Assessment:   Well woman visit with gynecologic exam. Status post endometrial ablation.  Hx GSI.  Mild. FH of breast and colon cancer.  Mother had breast cancer.  Hx nephrolithiasis.  Hx of basal cell CA and atypical moles.   Plan: Mammogram screening discussed. Self breast awareness reviewed. Pap and HR HPV as above. Guidelines for Calcium, Vitamin D, regular exercise program including cardiovascular and weight bearing exercise.   Follow up annually and prn.   After visit summary provided.

## 2021-03-18 ENCOUNTER — Encounter: Payer: Self-pay | Admitting: Obstetrics and Gynecology

## 2021-03-18 ENCOUNTER — Other Ambulatory Visit (HOSPITAL_COMMUNITY)
Admission: RE | Admit: 2021-03-18 | Discharge: 2021-03-18 | Disposition: A | Discharge status: Home / Self Care | Payer: BC Managed Care – PPO | Source: Ambulatory Visit | Attending: Obstetrics and Gynecology | Admitting: Obstetrics and Gynecology

## 2021-03-18 ENCOUNTER — Ambulatory Visit (INDEPENDENT_AMBULATORY_CARE_PROVIDER_SITE_OTHER): Payer: BC Managed Care – PPO | Admitting: Obstetrics and Gynecology

## 2021-03-18 ENCOUNTER — Other Ambulatory Visit: Payer: Self-pay

## 2021-03-18 VITALS — BP 114/80 | HR 66 | Resp 14 | Ht 66.5 in | Wt 131.0 lb

## 2021-03-18 DIAGNOSIS — Z01419 Encounter for gynecological examination (general) (routine) without abnormal findings: Secondary | ICD-10-CM | POA: Diagnosis not present

## 2021-03-18 DIAGNOSIS — Z124 Encounter for screening for malignant neoplasm of cervix: Secondary | ICD-10-CM | POA: Diagnosis not present

## 2021-03-18 NOTE — Patient Instructions (Signed)

## 2021-03-19 LAB — CYTOLOGY - PAP
Comment: NEGATIVE
Diagnosis: NEGATIVE
High risk HPV: NEGATIVE

## 2021-04-01 ENCOUNTER — Encounter: Payer: Self-pay | Admitting: Gastroenterology

## 2021-04-01 ENCOUNTER — Ambulatory Visit (INDEPENDENT_AMBULATORY_CARE_PROVIDER_SITE_OTHER): Payer: BC Managed Care – PPO | Admitting: Gastroenterology

## 2021-04-01 ENCOUNTER — Other Ambulatory Visit (INDEPENDENT_AMBULATORY_CARE_PROVIDER_SITE_OTHER): Payer: BC Managed Care – PPO

## 2021-04-01 VITALS — BP 100/70 | HR 80 | Ht 66.0 in | Wt 131.5 lb

## 2021-04-01 DIAGNOSIS — D509 Iron deficiency anemia, unspecified: Secondary | ICD-10-CM

## 2021-04-01 DIAGNOSIS — Z52008 Unspecified donor, other blood: Secondary | ICD-10-CM

## 2021-04-01 LAB — CBC WITH DIFFERENTIAL/PLATELET
Basophils Absolute: 0 10*3/uL (ref 0.0–0.1)
Basophils Relative: 0.6 % (ref 0.0–3.0)
Eosinophils Absolute: 0.1 10*3/uL (ref 0.0–0.7)
Eosinophils Relative: 1.8 % (ref 0.0–5.0)
HCT: 38.4 % (ref 36.0–46.0)
Hemoglobin: 12.4 g/dL (ref 12.0–15.0)
Lymphocytes Relative: 29.4 % (ref 12.0–46.0)
Lymphs Abs: 1.6 10*3/uL (ref 0.7–4.0)
MCHC: 32.4 g/dL (ref 30.0–36.0)
MCV: 85.7 fl (ref 78.0–100.0)
Monocytes Absolute: 0.4 10*3/uL (ref 0.1–1.0)
Monocytes Relative: 7.6 % (ref 3.0–12.0)
Neutro Abs: 3.3 10*3/uL (ref 1.4–7.7)
Neutrophils Relative %: 60.6 % (ref 43.0–77.0)
Platelets: 206 10*3/uL (ref 150.0–400.0)
RBC: 4.48 Mil/uL (ref 3.87–5.11)
RDW: 15.2 % (ref 11.5–15.5)
WBC: 5.5 10*3/uL (ref 4.0–10.5)

## 2021-04-01 LAB — IBC + FERRITIN
Ferritin: 6.3 ng/mL — ABNORMAL LOW (ref 10.0–291.0)
Iron: 50 ug/dL (ref 42–145)
Saturation Ratios: 9.5 % — ABNORMAL LOW (ref 20.0–50.0)
TIBC: 523.6 ug/dL — ABNORMAL HIGH (ref 250.0–450.0)
Transferrin: 374 mg/dL — ABNORMAL HIGH (ref 212.0–360.0)

## 2021-04-01 NOTE — Progress Notes (Signed)
HPI :  55 year old female with history of atypical nevus, basal cell carcinoma, hemorrhoid surgery, referred here by Dr. Kathlene November for iron deficiency anemia.  The patient had routine labs drawn November 4 showing hemoglobin of 11.5 with an MCV of 88.  Iron studies show a low ferritin of 7.6 and iron level of 28.  She had a colonoscopy with me in January 2018 which showed some diverticulosis but otherwise normal exam with no polyps and a good prep.  She denies any blood in her stools.  She denies any bowel habit changes.  She is not having any menstrual cycles.  She denies any abdominal pains.  No reflux, no dysphagia.  No nausea or vomiting.  She is eating well.  She has not been on any iron supplementation.  She used to take NSAIDs in the past but no longer takes them much at all.  She asks if donating blood could be related to iron deficiency.  She donates blood every few months for the past year or so.  Her last blood donation was October 26.  She is scheduled for another donation in the next few weeks.  No family history of first-degree relatives with a GI tract malignancy.  Her paternal grandfather had colon cancer.  No weight loss.  Otherwise feels well without any complaints   Labs 02/12/21: - iron 28 (low), ferritin 7.6 - Hgb 11.5, MCV 88.4  Colonoscopy 04/18/16 - The perianal and digital rectal examinations were normal. - Scattered medium-mouthed diverticula were found in the entire colon. - Anal papilla(e) were hypertrophied on retroflexion. - The exam was otherwise without abnormality on direct and retroflexion views. No polyps.      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   Right Outer Thigh - Moderate to Severe tx  wider shave   Atypical nevus 07/11/2007   Right Outer Thigh Sup - Severe tx-exc   Atypical nevus 07/11/2007   Right Outer Thigh Inf - Moderate to Marked tx widershave   Atypical nevus 01/23/2013   Lower Mid Back - Moderate to  Severe   Atypical nevus 03/17/2014   Right Post Thigh - Mild   Atypical nevus 10/17/2016   Right Buttock - Mild   Basal cell carcinoma 06/26/2017   left shoulder tx  cx3 77fu   BCC (basal cell carcinoma of skin) 09/25/2018   mid chest tx cx4 50fu   Colon polyps    Contraception    husband's vasectomy   Elevated serum creatinine 2017   Family history of breast cancer    mother (3) and cousin (61)   Family history of colon cancer    Fibroid 02/08/2016   2 cm    Kidney stones 06/2008   spon passed     Past Surgical History:  Procedure Laterality Date   ABLATION  11/24/2008   Novasure   CESAREAN SECTION  06/10/2003   ENDOMETRIAL BIOPSY  10/30/2008   benign   HEMORRHOID SURGERY  04/11/2010   KNEE SURGERY Left 06/1992 and 08/1992   ACL tore bone and second surgery was to remove scar tissue. Left knee   TONSILLECTOMY AND ADENOIDECTOMY     Family History  Problem Relation Age of Onset   Breast cancer Mother 75   Mitral valve prolapse Mother    Dementia Mother    Colon cancer Paternal Grandmother        59's   Breast cancer Cousin 91   CAD Neg  Hx    Diabetes Neg Hx    Social History   Tobacco Use   Smoking status: Never   Smokeless tobacco: Never  Vaping Use   Vaping Use: Never used  Substance Use Topics   Alcohol use: Yes    Alcohol/week: 2.0 - 3.0 standard drinks    Types: 2 - 3 Standard drinks or equivalent per week    Comment: 2-3 glasses of wine a week   Drug use: No   Current Outpatient Medications  Medication Sig Dispense Refill   imiquimod (ALDARA) 5 % cream Apply topically 3 (three) times a week. Apply to leg nightly Monday - Friday total 25 applications 12 each 0   No current facility-administered medications for this visit.   No Known Allergies   Review of Systems: All systems reviewed and negative except where noted in HPI.   Labs reviewed in Epic  CBC Latest Ref Rng & Units 04/01/2021 02/11/2021 02/11/2020  WBC 4.0 - 10.5 K/uL 5.5 4.9 6.0   Hemoglobin 12.0 - 15.0 g/dL 12.4 11.5(L) 13.2  Hematocrit 36.0 - 46.0 % 38.4 35.8(L) 40.6  Platelets 150.0 - 400.0 K/uL 206.0 231.0 197    Physical Exam: BP 100/70 (BP Location: Left Arm, Patient Position: Sitting, Cuff Size: Normal)    Pulse 80    Ht 5\' 6"  (1.676 m) Comment: height measured without shoes   Wt 131 lb 8 oz (59.6 kg)    BMI 21.22 kg/m  Constitutional: Pleasant,well-developed, female in no acute distress. HEENT: Normocephalic and atraumatic. Conjunctivae are normal. No scleral icterus. Neck supple.  Cardiovascular: Normal rate, regular rhythm.  Pulmonary/chest: Effort normal and breath sounds normal. No wheezing, rales or rhonchi. Abdominal: Soft, nondistended, nontender. . There are no masses palpable.  Extremities: no edema Lymphadenopathy: No cervical adenopathy noted. Neurological: Alert and oriented to person place and time. Skin: Skin is warm and dry. No rashes noted. Psychiatric: Normal mood and affect. Behavior is normal.   ASSESSMENT AND PLAN: 55 year old female here to reestablish care for the following:  Iron deficiency anemia Blood donor  Noted to have an iron deficiency and mild anemia in the setting of no upper or lower tract symptoms.  Last colonoscopy just about 5 years ago which was normal with a good prep.  In reviewing her history further with her she is a frequent blood donor and gave blood 1 week prior to her last blood draw.  I suspect more than likely her frequent blood donation is causing her iron deficiency anemia.  She is menopausal.  We discussed that in normal circumstances for iron deficiency anemia, an EGD and colonoscopy is recommended to clear the upper and lower tract and we discussed if she wanted to pursue that or not in light of her blood donation which may be the more likely cause.  We discussed what these procedures entailed and she is comfortable holding off on those and canceling future blood donation and see how things play out with  her blood counts.  I am going to repeat her CBC along with iron studies today.  If she has a worsening iron deficiency anemia despite holding off on blood donation then we may pursue endoscopic evaluation.  If her blood counts remain normal over the next several months then she would prefer to hold off on further endoscopic evaluation which I think is reasonable.  Let her know results of CBC and iron panel and further recommendations based on that result.  Jolly Mango, MD [facility]  CC:  Colon Branch, MD

## 2021-04-01 NOTE — Patient Instructions (Signed)
If you are age 55 or older, your body mass index should be between 23-30. Your Body mass index is 21.22 kg/m. If this is out of the aforementioned range listed, please consider follow up with your Primary Care Provider.  If you are age 109 or younger, your body mass index should be between 19-25. Your Body mass index is 21.22 kg/m. If this is out of the aformentioned range listed, please consider follow up with your Primary Care Provider.   ________________________________________________________  The [facility] providers would like to encourage you to use [facility] to communicate with providers for non-urgent requests or questions.  Due to long hold times on the telephone, sending your provider a message by [facility] may be a faster and more efficient way to get a response.  Please allow 48 business hours for a response.  Please remember that this is for non-urgent requests.  _______________________________________________________   Your provider has requested that you go to the basement level for lab work before leaving today. Press "B" on the elevator. The lab is located at the first door on the left as you exit the elevator.   Due to recent changes in healthcare laws, you may see the results of your imaging and laboratory studies on MyChart before your provider has had a chance to review them.  We understand that in some cases there may be results that are confusing or concerning to you. Not all laboratory results come back in the same time frame and the provider may be waiting for multiple results in order to interpret others.  Please give [place] 48 hours in order for your provider to thoroughly review all the results before contacting the office for clarification of your results.    It was a pleasure to see you today!  Thank you for trusting me with your gastrointestinal care!

## 2021-04-02 ENCOUNTER — Other Ambulatory Visit: Payer: Self-pay

## 2021-04-02 DIAGNOSIS — Z52008 Unspecified donor, other blood: Secondary | ICD-10-CM

## 2021-04-02 DIAGNOSIS — D509 Iron deficiency anemia, unspecified: Secondary | ICD-10-CM

## 2021-04-27 ENCOUNTER — Other Ambulatory Visit: Payer: Self-pay

## 2021-04-27 ENCOUNTER — Encounter: Payer: Self-pay | Admitting: Dermatology

## 2021-04-27 ENCOUNTER — Ambulatory Visit: Payer: BC Managed Care – PPO | Admitting: Dermatology

## 2021-04-27 DIAGNOSIS — L821 Other seborrheic keratosis: Secondary | ICD-10-CM | POA: Diagnosis not present

## 2021-04-27 DIAGNOSIS — Z85828 Personal history of other malignant neoplasm of skin: Secondary | ICD-10-CM | POA: Diagnosis not present

## 2021-04-27 DIAGNOSIS — L720 Epidermal cyst: Secondary | ICD-10-CM | POA: Diagnosis not present

## 2021-04-27 DIAGNOSIS — L738 Other specified follicular disorders: Secondary | ICD-10-CM

## 2021-04-27 DIAGNOSIS — L851 Acquired keratosis [keratoderma] palmaris et plantaris: Secondary | ICD-10-CM

## 2021-05-17 ENCOUNTER — Encounter: Payer: Self-pay | Admitting: Dermatology

## 2021-05-17 NOTE — Progress Notes (Signed)
° °  Follow-Up Visit   Subjective  Vanessa Schneider is a 56 y.o. female who presents for the following: Follow-up (New raised lesion on the bridge of the nose, alos bottom of the lip bump x months ).  History of skin cancers (nonmelanoma) left shin, new spots on lip and nose Location:  Duration:  Quality:  Associated Signs/Symptoms: Modifying Factors:  Severity:  Timing: Context:   Objective  Well appearing patient in no apparent distress; mood and affect are within normal limits. Mid Lower Vermilion Lip Half millimeter upper dermal firm white papule  Mid Tip of Nose 2 mm flesh-colored papule with subtle texture.  Dermoscopy shows features of both keratosis and sebaceous hyperplasia.  Left Lower Leg - Anterior Area smoother but uncertain if completely clear    A focused examination was performed including head, neck, arms, legs.. Relevant physical exam findings are noted in the Assessment and Plan.   Assessment & Plan    [place] Mid Lower Vermilion Lip  Discussed elective removal, no intervention initiated  Seborrheic keratosis Mid Tip of Nose  I offered to obtain confirmatory biopsy but patient is more comfortable having this done only if there is clinical change.  Recheck in 6+ months  Destruction of lesion - Mid Tip of Nose Complexity: simple   Destruction method: cryotherapy   Informed consent: discussed and consent obtained   Timeout:  patient name, date of birth, surgical site, and procedure verified Lesion destroyed using liquid nitrogen: Yes   Cryotherapy cycles:  3 Outcome: patient tolerated procedure well with no complications   Post-procedure details: wound care instructions given    Personal history of skin cancer Left Lower Leg - Anterior  return if there is growth or bleeding, otherwise recheck in 6+ months      I, [name], MD, have reviewed all documentation for this visit.  The documentation on 05/17/21 for the exam, diagnosis,  procedures, and orders are all accurate and complete.

## 2021-07-01 ENCOUNTER — Telehealth: Payer: Self-pay

## 2021-07-01 NOTE — Telephone Encounter (Signed)
-----   Message from Yevette Edwards, RN sent at 04/02/2021  7:39 AM EST ----- ?Regarding: Labs ?CBC/diff, IBC + Ferritin. The orders are in epic. ? ?

## 2021-07-01 NOTE — Telephone Encounter (Signed)
Spoke with patient to remind her that she is due for repeat labs at this time. No appointment is necessary. Patient is aware that she can stop by the lab in the basement at her convenience between 7:30 AM - 5 PM, Monday through Friday. Patient verbalized understanding and had no concerns at the end of the call.   

## 2021-07-05 ENCOUNTER — Ambulatory Visit: Payer: BC Managed Care – PPO | Admitting: Dermatology

## 2021-07-05 ENCOUNTER — Encounter: Payer: Self-pay | Admitting: Dermatology

## 2021-07-05 ENCOUNTER — Other Ambulatory Visit: Payer: Self-pay

## 2021-07-05 DIAGNOSIS — Z1283 Encounter for screening for malignant neoplasm of skin: Secondary | ICD-10-CM | POA: Diagnosis not present

## 2021-07-05 DIAGNOSIS — I781 Nevus, non-neoplastic: Secondary | ICD-10-CM

## 2021-07-05 DIAGNOSIS — L570 Actinic keratosis: Secondary | ICD-10-CM | POA: Diagnosis not present

## 2021-07-06 ENCOUNTER — Other Ambulatory Visit (INDEPENDENT_AMBULATORY_CARE_PROVIDER_SITE_OTHER): Payer: BC Managed Care – PPO

## 2021-07-06 DIAGNOSIS — Z52008 Unspecified donor, other blood: Secondary | ICD-10-CM

## 2021-07-06 DIAGNOSIS — D509 Iron deficiency anemia, unspecified: Secondary | ICD-10-CM | POA: Diagnosis not present

## 2021-07-06 LAB — CBC WITH DIFFERENTIAL/PLATELET
Basophils Absolute: 0 10*3/uL (ref 0.0–0.1)
Basophils Relative: 0.6 % (ref 0.0–3.0)
Eosinophils Absolute: 0.1 10*3/uL (ref 0.0–0.7)
Eosinophils Relative: 2.3 % (ref 0.0–5.0)
HCT: 38.3 % (ref 36.0–46.0)
Hemoglobin: 12.8 g/dL (ref 12.0–15.0)
Lymphocytes Relative: 33.8 % (ref 12.0–46.0)
Lymphs Abs: 1.8 10*3/uL (ref 0.7–4.0)
MCHC: 33.5 g/dL (ref 30.0–36.0)
MCV: 88.8 fl (ref 78.0–100.0)
Monocytes Absolute: 0.4 10*3/uL (ref 0.1–1.0)
Monocytes Relative: 7.7 % (ref 3.0–12.0)
Neutro Abs: 3 10*3/uL (ref 1.4–7.7)
Neutrophils Relative %: 55.6 % (ref 43.0–77.0)
Platelets: 178 10*3/uL (ref 150.0–400.0)
RBC: 4.31 Mil/uL (ref 3.87–5.11)
RDW: 16.6 % — ABNORMAL HIGH (ref 11.5–15.5)
WBC: 5.4 10*3/uL (ref 4.0–10.5)

## 2021-07-06 LAB — IBC + FERRITIN
Ferritin: 24.7 ng/mL (ref 10.0–291.0)
Iron: 86 ug/dL (ref 42–145)
Saturation Ratios: 21.6 % (ref 20.0–50.0)
TIBC: 397.6 ug/dL (ref 250.0–450.0)
Transferrin: 284 mg/dL (ref 212.0–360.0)

## 2021-07-23 ENCOUNTER — Encounter: Payer: Self-pay | Admitting: Dermatology

## 2021-07-23 NOTE — Progress Notes (Signed)
? ?  Follow-Up Visit ?  ?Subjective  ?Vanessa Schneider is a 56 y.o. female who presents for the following: Annual Exam (New lesion x 1 month- comes and goes on right nasal sidewall & few rough lesions on chest). ? ?Skin check, red spot on side of nose.  On chest ?Location:  ?Duration:  ?Quality:  ?Associated Signs/Symptoms: ?Modifying Factors:  ?Severity:  ?Timing: ?Context:  ? ?Objective  ?Well appearing patient in no apparent distress; mood and affect are within normal limits. ?Waist up exam: No atypical pigmented lesions or nonmelanoma skin cancer. ? ?Chest - Medial [facility]) ?4 mm gritty pink crust ? ?Right Nasal Sidewall ?Flat telangiectasia, easily blanches with pressure.  Dermoscopy shows no current signs of BCC. ? ? ? ? ? ? ?All sun exposed areas plus back examined.  Upper chest. ? ? ?Assessment & Plan  ? ? ?Encounter for screening for malignant neoplasm of skin ? ?Annual skin examination, encouraged to self examine with spouse twice annually.  Continue ultraviolet protection. ? ?AK (actinic keratosis) ?Chest - Medial [facility]) ? ?Destruction of lesion - Chest - Medial [facility]) ?Complexity: simple   ?Destruction method: cryotherapy   ?Informed consent: discussed and consent obtained   ?Timeout:  patient name, date of birth, surgical site, and procedure verified ?Lesion destroyed using liquid nitrogen: Yes   ?Cryotherapy cycles:  3 ?Outcome: patient tolerated procedure well with no complications   ?Post-procedure details: wound care instructions given   ? ?Telangiectasia ?Right Nasal Sidewall ? ?I did discuss with patient that dilated vessels are often seen with basal cell skin cancer and that biopsy is more accurate in my examination.  She was comfortable leaving this be unless there is clinical change. ? ? ? ? ? ?I, [name], MD, have reviewed all documentation for this visit.  The documentation on 07/23/21 for the exam, diagnosis, procedures, and orders are all accurate and complete. ?

## 2021-08-18 ENCOUNTER — Ambulatory Visit: Payer: BC Managed Care – PPO | Admitting: Dermatology

## 2021-11-17 ENCOUNTER — Other Ambulatory Visit: Payer: Self-pay | Admitting: Obstetrics and Gynecology

## 2021-11-17 DIAGNOSIS — Z1231 Encounter for screening mammogram for malignant neoplasm of breast: Secondary | ICD-10-CM

## 2021-11-30 ENCOUNTER — Ambulatory Visit
Admission: RE | Admit: 2021-11-30 | Discharge: 2021-11-30 | Disposition: A | Discharge status: Home / Self Care | Payer: BC Managed Care – PPO | Source: Ambulatory Visit

## 2021-11-30 DIAGNOSIS — Z1231 Encounter for screening mammogram for malignant neoplasm of breast: Secondary | ICD-10-CM

## 2022-01-10 ENCOUNTER — Ambulatory Visit: Payer: BC Managed Care – PPO | Admitting: Dermatology

## 2022-02-12 IMAGING — MG DIGITAL DIAGNOSTIC BILAT W/ TOMO W/ CAD
8 series · 8 of 24 positions shown · non-contrast
Comparison: Previous exam(s).

CLINICAL DATA: 55-year-old female presenting for evaluation of
bilateral superficial axillary lumps and rash which have now
resolved. Patient is also due for annual exam.

EXAM:
DIGITAL DIAGNOSTIC BILATERAL MAMMOGRAM WITH TOMOSYNTHESIS AND CAD
TECHNIQUE: Bilateral digital diagnostic mammography and breast tomosynthesis
was performed. The images were evaluated with computer-aided
detection.

[L MLO synth-2D]
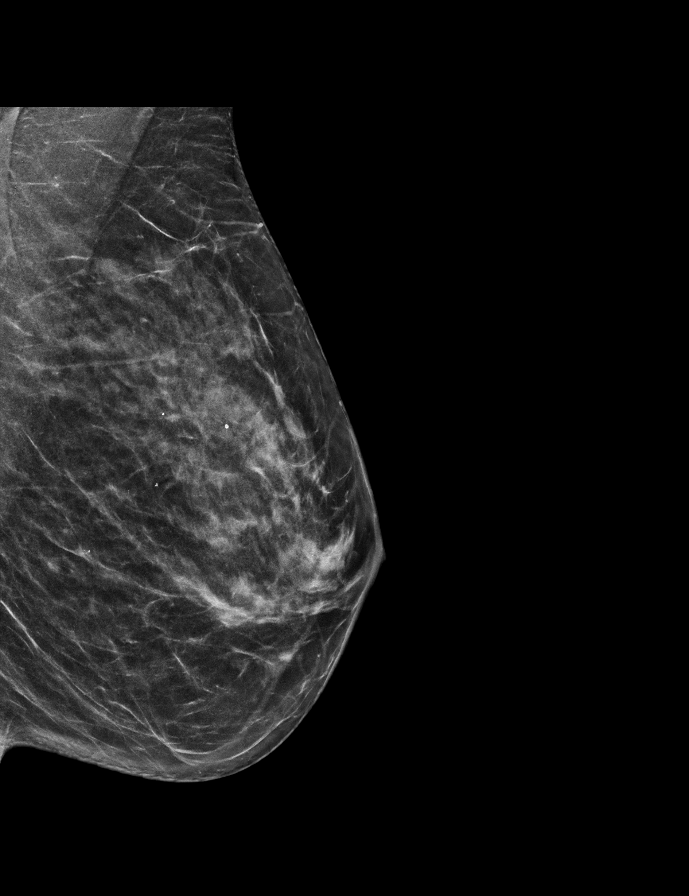

[R CC synth-2D]
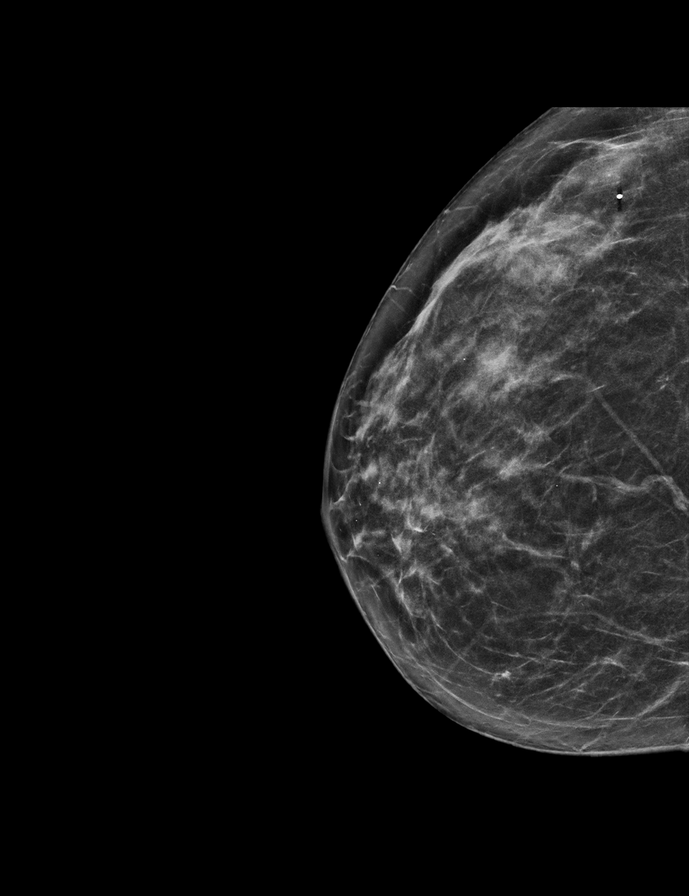

[L CC synth-2D]
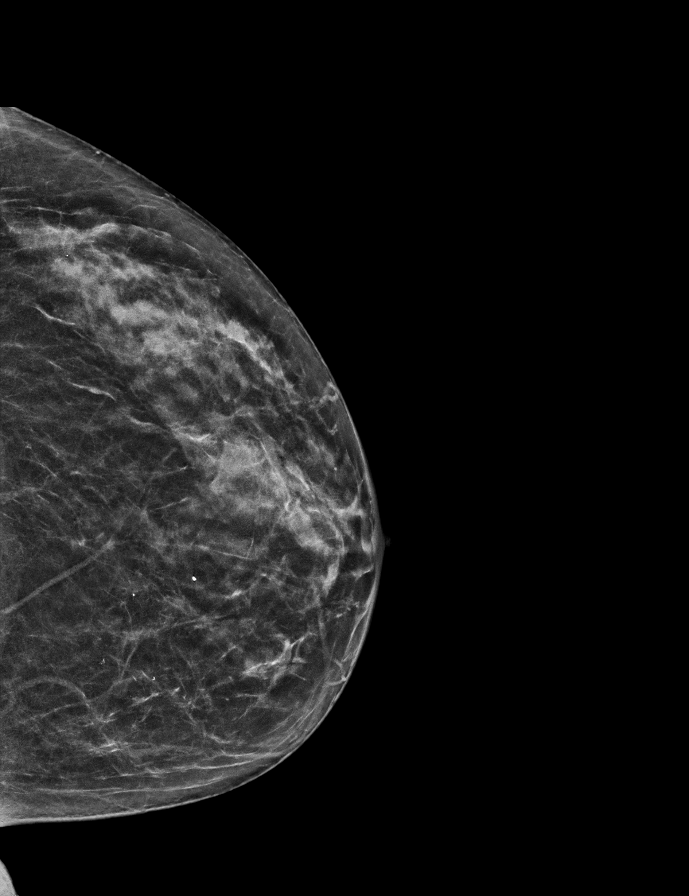

[R MLO synth-2D]
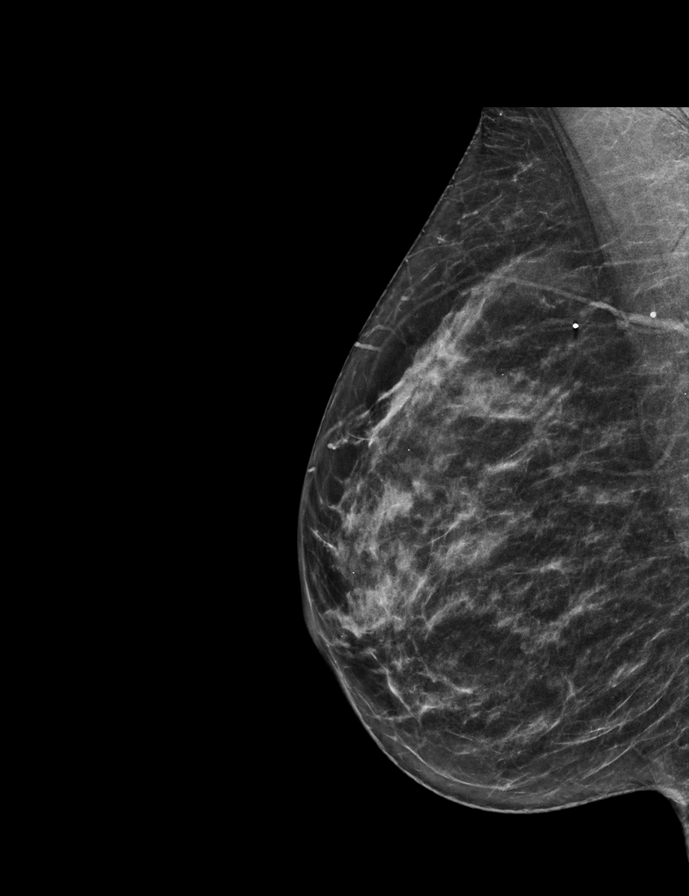

[L MLO tomo · tomo slice 27/54.0]
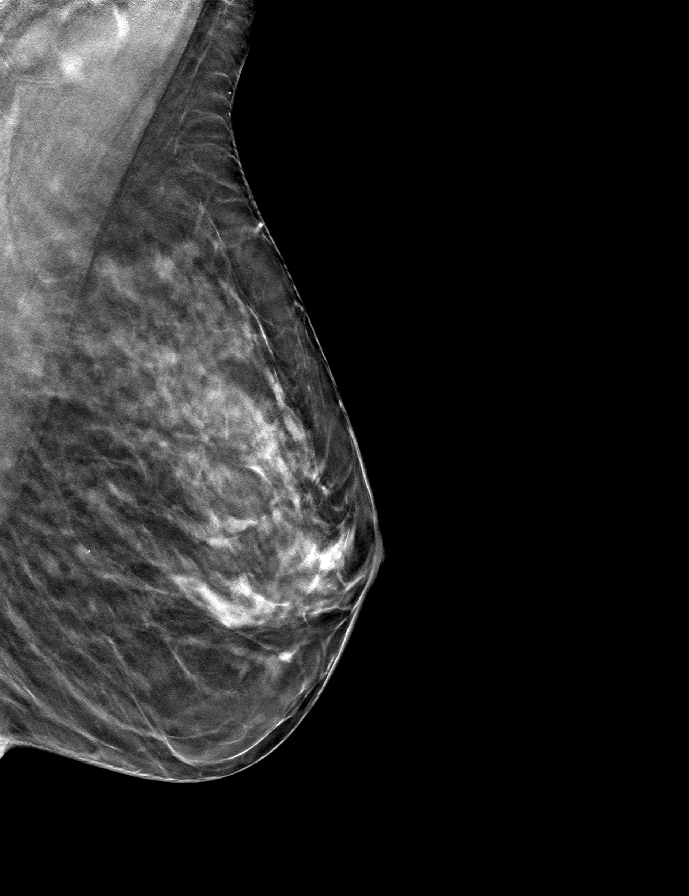

[R MLO tomo · tomo slice 30/59.0]
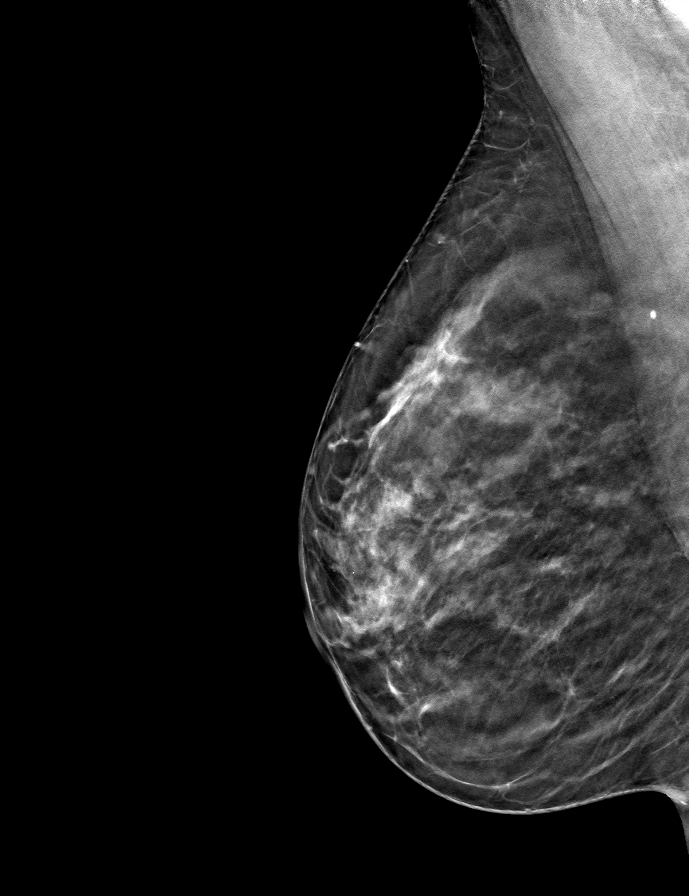

[L CC tomo · tomo slice 25/50.0]
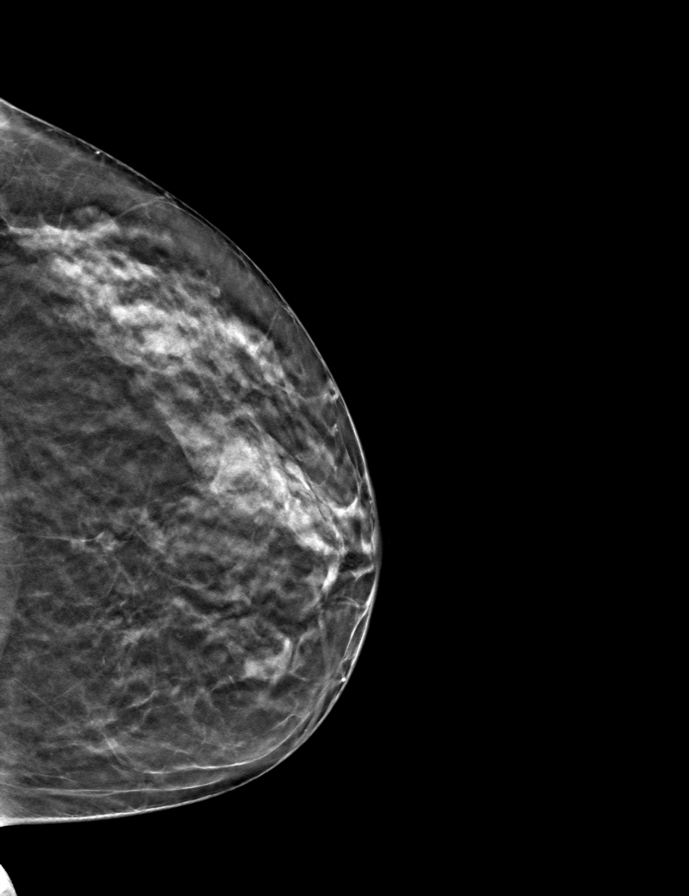

[R CC tomo · tomo slice 29/58.0]
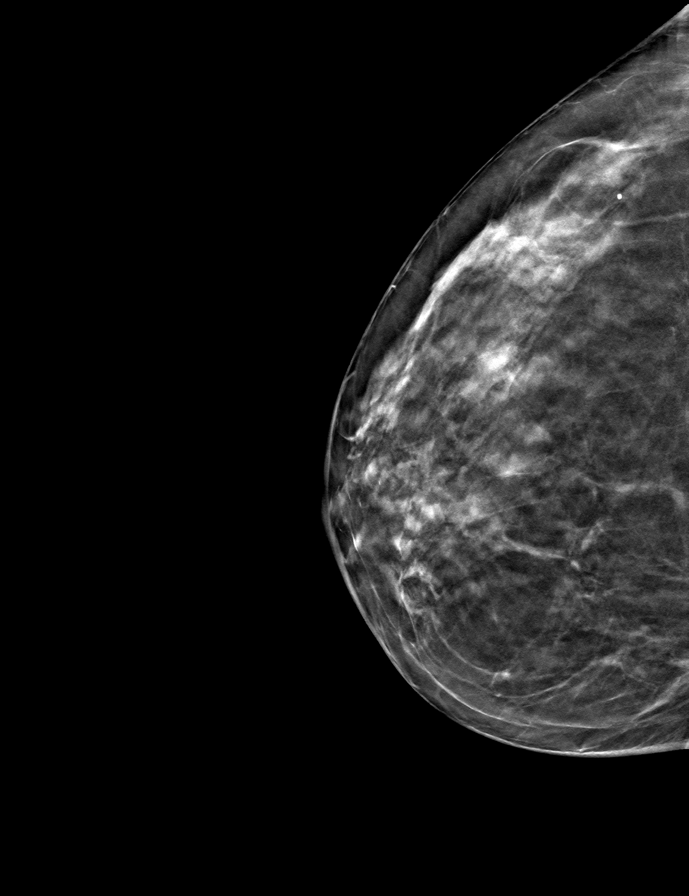

[8 of 24 positions shown; findings below may reference images not displayed]

ACR Breast Density Category c: The breast tissue is heterogeneously
dense, which may obscure small masses.
FINDINGS: Right breast: No suspicious mass, distortion, or microcalcifications
are identified to suggest presence of malignancy. Visualized
portions of the right axilla demonstrate no new abnormality.

Left breast: No suspicious mass, distortion, or microcalcifications
are identified to suggest presence of malignancy. Visualized
portions of the left axilla demonstrate no new abnormality. Several
normal lymph nodes are identified.
IMPRESSION: No mammographic evidence of malignancy bilaterally in the breasts or
new abnormality in visualized portions of the bilateral axillary
regions. Patient reports the palpable lumps have resolved.

RECOMMENDATION:
1.  Clinical follow-up as needed for bilateral axillary concerns.

2.  Screening mammogram in one year.(Code:7H-P-XY1)

I have discussed the findings and recommendations with the patient.
If applicable, a reminder letter will be sent to the patient
regarding the next appointment.

BI-RADS CATEGORY  1: Negative.

## 2022-02-16 ENCOUNTER — Ambulatory Visit (INDEPENDENT_AMBULATORY_CARE_PROVIDER_SITE_OTHER): Payer: BC Managed Care – PPO | Admitting: Internal Medicine

## 2022-02-16 ENCOUNTER — Encounter: Payer: Self-pay | Admitting: Internal Medicine

## 2022-02-16 VITALS — BP 108/68 | HR 65 | Temp 97.8°F | Resp 16 | Ht 66.5 in | Wt 131.0 lb

## 2022-02-16 DIAGNOSIS — D509 Iron deficiency anemia, unspecified: Secondary | ICD-10-CM | POA: Diagnosis not present

## 2022-02-16 DIAGNOSIS — Z0000 Encounter for general adult medical examination without abnormal findings: Secondary | ICD-10-CM

## 2022-02-16 DIAGNOSIS — Z23 Encounter for immunization: Secondary | ICD-10-CM | POA: Diagnosis not present

## 2022-02-16 DIAGNOSIS — E559 Vitamin D deficiency, unspecified: Secondary | ICD-10-CM

## 2022-02-16 DIAGNOSIS — Z780 Asymptomatic menopausal state: Secondary | ICD-10-CM | POA: Diagnosis not present

## 2022-02-16 LAB — LIPID PANEL
Cholesterol: 211 mg/dL — ABNORMAL HIGH (ref 0–200)
HDL: 87.4 mg/dL (ref >39.00)
LDL Cholesterol: 103 mg/dL — ABNORMAL HIGH (ref 0–99)
NonHDL: 123.69
Total CHOL/HDL Ratio: 2
Triglycerides: 102 mg/dL (ref 0.0–149.0)
VLDL: 20.4 mg/dL (ref 0.0–40.0)

## 2022-02-16 LAB — CBC WITH DIFFERENTIAL/PLATELET
Basophils Absolute: 0 10*3/uL (ref 0.0–0.1)
Basophils Relative: 0.6 % (ref 0.0–3.0)
Eosinophils Absolute: 0.1 10*3/uL (ref 0.0–0.7)
Eosinophils Relative: 2.1 % (ref 0.0–5.0)
HCT: 41.4 % (ref 36.0–46.0)
Hemoglobin: 13.9 g/dL (ref 12.0–15.0)
Lymphocytes Relative: 32.7 % (ref 12.0–46.0)
Lymphs Abs: 1.6 10*3/uL (ref 0.7–4.0)
MCHC: 33.5 g/dL (ref 30.0–36.0)
MCV: 94.4 fl (ref 78.0–100.0)
Monocytes Absolute: 0.4 10*3/uL (ref 0.1–1.0)
Monocytes Relative: 7.7 % (ref 3.0–12.0)
Neutro Abs: 2.7 10*3/uL (ref 1.4–7.7)
Neutrophils Relative %: 56.9 % (ref 43.0–77.0)
Platelets: 204 10*3/uL (ref 150.0–400.0)
RBC: 4.39 Mil/uL (ref 3.87–5.11)
RDW: 12.7 % (ref 11.5–15.5)
WBC: 4.8 10*3/uL (ref 4.0–10.5)

## 2022-02-16 LAB — COMPREHENSIVE METABOLIC PANEL
ALT: 25 U/L (ref 0–35)
AST: 22 U/L (ref 0–37)
Albumin: 4.7 g/dL (ref 3.5–5.2)
Alkaline Phosphatase: 59 U/L (ref 39–117)
BUN: 19 mg/dL (ref 6–23)
CO2: 31 mEq/L (ref 19–32)
Calcium: 10 mg/dL (ref 8.4–10.5)
Chloride: 101 mEq/L (ref 96–112)
Creatinine, Ser: 0.74 mg/dL (ref 0.40–1.20)
GFR: 90.48 mL/min (ref >60.00)
Glucose, Bld: 86 mg/dL (ref 70–99)
Potassium: 4.4 mEq/L (ref 3.5–5.1)
Sodium: 138 mEq/L (ref 135–145)
Total Bilirubin: 0.6 mg/dL (ref 0.2–1.2)
Total Protein: 6.9 g/dL (ref 6.0–8.3)

## 2022-02-16 LAB — IBC + FERRITIN
Ferritin: 56.9 ng/mL (ref 10.0–291.0)
Iron: 106 ug/dL (ref 42–145)
Saturation Ratios: 27.9 % (ref 20.0–50.0)
TIBC: 379.4 ug/dL (ref 250.0–450.0)
Transferrin: 271 mg/dL (ref 212.0–360.0)

## 2022-02-16 LAB — VITAMIN D 25 HYDROXY (VIT D DEFICIENCY, FRACTURES): VITD: 39.71 ng/mL (ref 30.00–100.00)

## 2022-02-16 MED ORDER — VITAMIN D3 50 MCG (2000 UT) PO CAPS: 2000.0000 [IU] | ORAL_CAPSULE | Freq: Every day | ORAL | Status: AC

## 2022-02-16 NOTE — Patient Instructions (Addendum)
Vaccines I recommend:  Shingrix (shingles) Covid booster  Start vitamin D3 over-the-counter: 2000 units daily.  GO TO THE LAB : Get the blood work     [place], [place] back for   a physical exam in 1 year      Do you have a "Living will" or "[place] of attorney"? (Advance care planning documents)  If you already have a living will or healthcare power of attorney, is recommended you bring the copy to be scanned in your chart. The document will be available to all the doctors you see in the system.  If you don't have one, please consider create one.    More information at:  [email or web address]

## 2022-02-16 NOTE — Assessment & Plan Note (Signed)
Here for CPX Menopausal: Symptoms are decreasing. IDA: Found to have iron deficiency, saw GI 04/01/2021, etiology suspected to be blood donation.  They agree to proceed with further evaluation if continue with IDA despite holding donations.  At this point she has not donated for a year, follow-up labs were better. Plan: Recheck CBC, iron panel, start donations less frequently if labs are okay. RTC 1 year

## 2022-02-16 NOTE — Assessment & Plan Note (Addendum)
-  Td 11-2016 -Shingrix d/w pt  - COVID vax rec  -Flu shot today -CCS: cscope no polyps 04-2016, 10 years -Female care per Dr Quincy Simmonds; MMG [phone] -[facility] -Bone health: no personal history of fractures, mother had osteopenia, patient is a small frame. Start Vit D 2,000 u qd , order a DEXA . -Labs: CMP FLP CBC iron vitamin D  -Diet and exercise: Has a healthy lifestyle --POA discussed

## 2022-02-16 NOTE — Progress Notes (Signed)
Subjective:    Patient ID: Vanessa Schneider, female    DOB: 1965-08-11, 56 y.o.   MRN: [id number]  DOS:  02/16/2022 Type of visit - description: CPX  Here for CPX Since the last office visit she is doing well. Had IDA, saw GI, note reviewed. Has a history of palpitations, they are fading away, very rarely has them now. No associated chest pain, difficulty breathing.  Denies significant stress.   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   Right Outer Thigh - Moderate to Severe tx  wider shave   Atypical nevus 07/11/2007   Right Outer Thigh Sup - Severe tx-exc   Atypical nevus 07/11/2007   Right Outer Thigh Inf - Moderate to Marked tx widershave   Atypical nevus 01/23/2013   Lower Mid Back - Moderate to Severe   Atypical nevus 03/17/2014   Right Post Thigh - Mild   Atypical nevus 10/17/2016   Right Buttock - Mild   Basal cell carcinoma 06/26/2017   left shoulder tx  cx3 27f   BCC (basal cell carcinoma of skin) 09/25/2018   mid chest tx cx4 525f  Colon polyps    Contraception    husband's vasectomy   Elevated serum creatinine 2017   Family history of breast cancer    mother ([phone] cousin ([phone]  Family history of colon cancer    Fibroid 02/08/2016   2 cm    Kidney stones 06/2008   spon passed    Past Surgical History:  Procedure Laterality Date   ABLATION  11/24/2008   Novasure   CESAREAN SECTION  06/10/2003   ENDOMETRIAL BIOPSY  10/30/2008   benign   HEMORRHOID SURGERY  04/11/2010   KNEE SURGERY Left 06/1992 and 08/1992   ACL tore bone and second surgery was to remove scar tissue. 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)    [place] [employer]   Smoking status: Never   Smokeless tobacco: Never  Vaping Use   Vaping Use: Never used  Substance and Sexual Activity   Alcohol use: Yes    Alcohol/week: 2.0 - 3.0 standard drinks of alcohol    Types: 2 - 3 Standard drinks or equivalent per week    Comment: 2-3 glasses of wine a week   Drug use: No   Sexual activity: Yes    Partners: Male    Birth control/protection: Other-see comments    Comment: vasectomy/novasure ablation 11/2008  Other Topics Concern   Not on file  Social History Narrative   Household- pt , husband    daughter in college   Social Determinants of Health   Financial Resource Strain: Not on file  Food Insecurity: Not on file  Transportation Needs: Not on file  Physical Activity: Not on file  Stress: Not on file  Social Connections: Not on file  Intimate Partner Violence: Not on file       Current Outpatient Medications  Medication Instructions   Vitamin D3 2,000 Units,  Oral, Daily       Objective:   Physical Exam BP 108/68   Pulse 65   Temp 97.8 F (36.6 C) (Oral)   Resp 16   Ht 5' 6.5" (1.689 m)   Wt 131 lb (59.4 kg)   SpO2 98%   BMI 20.83 kg/m  General: Well developed, NAD, BMI noted Neck: No  thyromegaly  HEENT:  Normocephalic . Face symmetric, atraumatic Lungs:  CTA B Normal respiratory effort, no intercostal retractions, no accessory muscle use. Heart: RRR,  no murmur.  Abdomen:  Not distended, soft, non-tender. No rebound or rigidity.   Lower extremities: no pretibial edema bilaterally  Skin: Exposed areas without rash. Not pale. Not jaundice Neurologic:  alert & oriented X3.  Speech normal, gait appropriate for age and unassisted Strength symmetric and appropriate for age.  Psych: Cognition and judgment appear intact.  Cooperative with normal attention span and concentration.  Behavior appropriate. No anxious or depressed  appearing.     Assessment    ASSESSMENT Kidney stones, 2010 Menopause   BC: s/p ablation d/t DUB  [facility] Sees dermatology  PLAN  Here for CPX Menopausal: Symptoms are decreasing. IDA: Found to have iron deficiency, saw GI 04/01/2021, etiology suspected to be blood donation.  They agree to proceed with further evaluation if continue with IDA despite holding donations.  At this point she has not donated for a year, follow-up labs were better. Plan: Recheck CBC, iron panel, start donations less frequently if labs are okay. RTC 1 year

## 2022-02-21 ENCOUNTER — Ambulatory Visit (HOSPITAL_BASED_OUTPATIENT_CLINIC_OR_DEPARTMENT_OTHER)
Admission: RE | Admit: 2022-02-21 | Discharge: 2022-02-21 | Disposition: A | Discharge status: Home / Self Care | Payer: BC Managed Care – PPO | Source: Ambulatory Visit | Attending: Internal Medicine | Admitting: Internal Medicine

## 2022-02-21 ENCOUNTER — Encounter: Payer: Self-pay | Admitting: Internal Medicine

## 2022-02-21 DIAGNOSIS — E559 Vitamin D deficiency, unspecified: Secondary | ICD-10-CM | POA: Insufficient documentation

## 2022-02-21 DIAGNOSIS — Z780 Asymptomatic menopausal state: Secondary | ICD-10-CM | POA: Insufficient documentation

## 2022-08-02 NOTE — Progress Notes (Signed)
57 y.o. G1P1 Married Caucasian female here for annual exam.    Asking about menopause.  She wants to do blood work.  Hot flashes are now rare.  Emotions are not as sensitive.  Does have some vaginal dryness.  Sees Dr. Imogene Burn in [facility] for dermatology care.  Has appointment this week.   PCP:   Dr. Drue Novel  No LMP recorded. Patient has had an ablation.           Sexually active: Yes.    The current method of family planning is ablation/vasectomy.    Exercising: Yes.     walking Smoker:  no  Health Maintenance: Pap:  03/18/21 neg: HR HPV neg, 03/31/16 neg: HR HPV neg History of abnormal Pap:  no MMG:  11/30/21 Breast Density Cat C, BI-RADS CAT 1 neg Colonoscopy:  04/18/16 - normal - due in 2028.  BMD:   02/21/22  Result  osteopenic TDaP:  2018  Gardasil:   no HIV: 02/01/16 NR Hep C: 02/11/20 NR Screening Labs: PCP Shingrix:  discussed    reports that she has never smoked. She has never used smokeless tobacco. She reports current alcohol use of about 2.0 - 3.0 standard drinks of alcohol per week. She reports that she does not use drugs.  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   Right Outer Thigh - Moderate to Severe tx  wider shave   Atypical nevus 07/11/2007   Right Outer Thigh Sup - Severe tx-exc   Atypical nevus 07/11/2007   Right Outer Thigh Inf - Moderate to Marked tx widershave   Atypical nevus 01/23/2013   Lower Mid Back - Moderate to Severe   Atypical nevus 03/17/2014   Right Post Thigh - Mild   Atypical nevus 10/17/2016   Right Buttock - Mild   Basal cell carcinoma 06/26/2017   left shoulder tx  cx3 23fu   BCC (basal cell carcinoma of skin) 09/25/2018   mid chest tx cx4 11fu   Colon polyps    Contraception    husband's vasectomy   Elevated serum creatinine 2017   Family history of breast cancer    mother (29) and cousin (73)   Family history of colon cancer    Fibroid 02/08/2016   2 cm    Kidney stones 06/2008   spon  passed    Past Surgical History:  Procedure Laterality Date   ABLATION  11/24/2008   Novasure   CESAREAN SECTION  06/10/2003   ENDOMETRIAL BIOPSY  10/30/2008   benign   HEMORRHOID SURGERY  04/11/2010   KNEE SURGERY Left 06/1992 and 08/1992   ACL tore bone and second surgery was to remove scar tissue. Left knee   TONSILLECTOMY AND ADENOIDECTOMY      Current Outpatient Medications  Medication Sig Dispense Refill   Cholecalciferol (VITAMIN D3) 50 MCG (2000 UT) capsule Take 1 capsule (2,000 Units total) by mouth daily. (Patient not taking: Reported on 08/16/2022)     No current facility-administered medications for this visit.    Family History  Problem Relation Age of Onset   Breast cancer Mother 74   Mitral valve prolapse Mother    Dementia Mother    Colon cancer Paternal Grandmother        2's   Breast cancer Cousin 18   CAD Neg Hx    Diabetes Neg Hx     Review of Systems  All other systems reviewed and are negative.  Exam:   BP 118/72 (BP Location: Right Arm, Patient Position: Sitting, Cuff Size: Normal)   Pulse 76   Ht 5\' 7"  (1.702 m)   Wt 125 lb (56.7 kg)   SpO2 100%   BMI 19.58 kg/m     General appearance: alert, cooperative and appears stated age Head: normocephalic, without obvious abnormality, atraumatic Neck: no adenopathy, supple, symmetrical, trachea midline and thyroid normal to inspection and palpation Lungs: clear to auscultation bilaterally Breasts: normal appearance, no masses or tenderness, No nipple retraction or dimpling, No nipple discharge or bleeding, No axillary adenopathy Heart: regular rate and rhythm Abdomen: soft, non-tender; no masses, no organomegaly Extremities: extremities normal, atraumatic, no cyanosis or edema Skin: skin color, texture, turgor normal. No rashes or lesions Lymph nodes: cervical, supraclavicular, and axillary nodes normal. Neurologic: grossly normal  Pelvic: External genitalia:  no lesions              No  abnormal inguinal nodes palpated.              Urethra:  normal appearing urethra with no masses, tenderness or lesions              Bartholins and Skenes: normal                 Vagina: normal appearing vagina with normal color and discharge, no lesions              Cervix: no lesions              Pap taken: no Bimanual Exam:  Uterus:  normal size, contour, position, consistency, mobility, non-tender              Adnexa: no mass, fullness, tenderness              Rectal exam: yes.  Confirms.              Anus:  normal sphincter tone, no lesions  Chaperone was present for exam:  Warren Lacy, CMA  Assessment:   Well woman visit with gynecologic exam. Status post endometrial ablation.  Hx GSI.  Mild. FH of breast and colon cancer.  Mother had breast cancer.  Hx nephrolithiasis.  Hx of basal cell CA and atypical moles.  Menopausal symptoms.  Vaginal atrophy.  Plan: Mammogram screening discussed. Self breast awareness reviewed. Pap and HR HPV 2027.  Guidelines for Calcium, Vitamin D, regular exercise program including cardiovascular and weight bearing exercise. We discussed tx for vaginal atrophy including water based lubricants, vit E for vagina, cooking oils, intrarosa, Osphena, and vaginal estrogen.  We discussed potential effect of vaginal estrogen on breast cancer.   She will let me know if she wishes a prescription in the future.   Will check FSH and estradiol.  Brochure on menopause to patient.  Follow up annually and prn.

## 2022-08-16 ENCOUNTER — Ambulatory Visit (INDEPENDENT_AMBULATORY_CARE_PROVIDER_SITE_OTHER): Payer: BC Managed Care – PPO | Admitting: Obstetrics and Gynecology

## 2022-08-16 ENCOUNTER — Encounter: Payer: Self-pay | Admitting: Obstetrics and Gynecology

## 2022-08-16 VITALS — BP 118/72 | HR 76 | Ht 67.0 in | Wt 125.0 lb

## 2022-08-16 DIAGNOSIS — Z01419 Encounter for gynecological examination (general) (routine) without abnormal findings: Secondary | ICD-10-CM | POA: Diagnosis not present

## 2022-08-16 DIAGNOSIS — N951 Menopausal and female climacteric states: Secondary | ICD-10-CM | POA: Diagnosis not present

## 2022-08-16 NOTE — Patient Instructions (Signed)
EXERCISE AND DIET:  We recommended that you start or continue a regular exercise program for good health. Regular exercise means any activity that makes your heart beat faster and makes you sweat.  We recommend exercising at least 30 minutes per day at least 3 days a week, preferably 4 or 5.  We also recommend a diet low in fat and sugar.  Inactivity, poor dietary choices and obesity can cause diabetes, heart attack, stroke, and kidney damage, among others.    ALCOHOL AND SMOKING:  Women should limit their alcohol intake to no more than 7 drinks/beers/glasses of wine (combined, not each!) per week. Moderation of alcohol intake to this level decreases your risk of breast cancer and liver damage. And of course, no recreational drugs are part of a healthy lifestyle.  And absolutely no smoking or even second hand smoke. Most people know smoking can cause heart and lung diseases, but did you know it also contributes to weakening of your bones? Aging of your skin?  Yellowing of your teeth and nails?  CALCIUM AND VITAMIN D:  Adequate intake of calcium and Vitamin D are recommended.  The recommendations for exact amounts of these supplements seem to change often, but generally speaking 600 mg of calcium (either carbonate or citrate) and 800 units of Vitamin D per day seems prudent. Certain women may benefit from higher intake of Vitamin D.  If you are among these women, your doctor will have told you during your visit.    PAP SMEARS:  Pap smears, to check for cervical cancer or precancers,  have traditionally been done yearly, although recent scientific advances have shown that most women can have pap smears less often.  However, every woman still should have a physical exam from her gynecologist every year. It will include a breast check, inspection of the vulva and vagina to check for abnormal growths or skin changes, a visual exam of the cervix, and then an exam to evaluate the size and shape of the uterus and  ovaries.  And after 57 years of age, a rectal exam is indicated to check for rectal cancers. We will also provide age appropriate advice regarding health maintenance, like when you should have certain vaccines, screening for sexually transmitted diseases, bone density testing, colonoscopy, mammograms, etc.   MAMMOGRAMS:  All women over 40 years old should have a yearly mammogram. Many facilities now offer a "3D" mammogram, which may cost around $50 extra out of pocket. If possible,  we recommend you accept the option to have the 3D mammogram performed.  It both reduces the number of women who will be called back for extra views which then turn out to be normal, and it is better than the routine mammogram at detecting truly abnormal areas.    COLONOSCOPY:  Colonoscopy to screen for colon cancer is recommended for all women at age 50.  We know, you hate the idea of the prep.  We agree, BUT, having colon cancer and not knowing it is worse!!  Colon cancer so often starts as a polyp that can be seen and removed at colonscopy, which can quite literally save your life!  And if your first colonoscopy is normal and you have no family history of colon cancer, most women don't have to have it again for 10 years.  Once every ten years, you can do something that may end up saving your life, right?  We will be happy to help you get it scheduled when you are ready.    Be sure to check your insurance coverage so you understand how much it will cost.  It may be covered as a preventative service at no cost, but you should check your particular policy.    Atrophic Vaginitis  Atrophic vaginitis is a condition in which the tissues that line the vagina become dry and thin. This condition is most common in women who have stopped having regular menstrual periods (are in menopause). This usually starts when a woman is 45 to 57 years old. That is the time when a woman's estrogen levels begin to decrease. Estrogen is a female hormone.  It helps to keep the tissues of the vagina moist. It stimulates the vagina to produce a clear fluid that lubricates the vagina for sex. This fluid also protects the vagina from infection. Lack of estrogen can cause the lining of the vagina to get thinner and dryer. The vagina may also shrink in size. It may become less elastic. Atrophic vaginitis tends to get worse over time as a woman's estrogen level drops. What are the causes? This condition is caused by the normal drop in estrogen that happens around the time of menopause. What increases the risk? Certain conditions or situations may lower a woman's estrogen level, leading to a higher risk for atrophic vaginitis. You are more likely to develop this condition if: You are taking medicines that block estrogen. You have had your ovaries removed. You are being treated for cancer with radiation or medicines (chemotherapy). You have given birth or are breastfeeding. You are older than age 50. You smoke. What are the signs or symptoms? Symptoms of this condition include: Pain, soreness, a feeling of pressure, or bleeding during sex (dyspareunia). Vaginal burning, irritation, or itching. Pain or bleeding when a speculum is used in a vaginal exam. Having burning pain while urinating. Vaginal discharge. In some cases, there are no symptoms. How is this diagnosed? This condition is diagnosed based on your medical history and a physical exam. This will include a pelvic exam that checks the vaginal tissues. Though rare, you may also have other tests, including: A urine test. A test that checks the acid balance in your vagina (acid balance test). How is this treated? Treatment for this condition depends on how severe your symptoms are. Treatment may include: Using an over-the-counter vaginal lubricant before sex. Using a long-acting vaginal moisturizer. Using low-dose estrogen for moderate to severe symptoms that do not respond to other treatments.  Options include creams, tablets, and inserts (vaginal rings). Before you use a vaginal estrogen, tell your health care provider if you have a history of: Breast cancer. Endometrial cancer. Blood clots. If you are not sexually active and your symptoms are very mild, you may not need treatment. Follow these instructions at home: Medicines Take over-the-counter and prescription medicines only as told by your health care provider. Do not use herbal or alternative medicines unless your health care provider says that you can. Use over-the-counter creams, lubricants, or moisturizers for dryness only as told by your health care provider. General instructions If your atrophic vaginitis is caused by menopause, discuss all of your menopause symptoms and treatment options with your health care provider. Do not douche. Do not use products that can make your vagina dry. These include: Scented feminine sprays. Scented tampons. Scented soaps. Vaginal sex can help to improve blood flow and elasticity of vaginal tissue. If you choose to have sex and it hurts, try using a water-soluble lubricant or moisturizer right before having sex. Contact   a health care provider if: Your discharge looks different than normal. Your vagina has an unusual smell. You have new symptoms. Your symptoms do not improve with treatment. Your symptoms get worse. Summary Atrophic vaginitis is a condition in which the tissues that line the vagina become dry and thin. It is most common in women who have stopped having regular menstrual periods (are in menopause). Treatment options include using vaginal lubricants and low-dose vaginal estrogen. Contact a health care provider if your vagina has an unusual smell, or if your symptoms get worse or do not improve after treatment. This information is not intended to replace advice given to you by your health care provider. Make sure you discuss any questions you have with your health care  provider. Document Revised: 09/26/2019 Document Reviewed: 09/26/2019 Elsevier Patient Education  2023 Elsevier Inc.  

## 2022-08-17 LAB — ESTRADIOL: Estradiol: <15 pg/mL

## 2022-08-17 LAB — FOLLICLE STIMULATING HORMONE: FSH: 147.6 m[IU]/mL — ABNORMAL HIGH

## 2022-12-20 ENCOUNTER — Other Ambulatory Visit: Payer: Self-pay | Admitting: Obstetrics and Gynecology

## 2022-12-20 DIAGNOSIS — Z1231 Encounter for screening mammogram for malignant neoplasm of breast: Secondary | ICD-10-CM

## 2023-01-12 ENCOUNTER — Ambulatory Visit
Admission: RE | Admit: 2023-01-12 | Discharge: 2023-01-12 | Disposition: A | Discharge status: Home / Self Care | Payer: BC Managed Care – PPO | Source: Ambulatory Visit | Attending: Obstetrics and Gynecology | Admitting: Obstetrics and Gynecology

## 2023-01-12 DIAGNOSIS — Z1231 Encounter for screening mammogram for malignant neoplasm of breast: Secondary | ICD-10-CM

## 2023-02-21 ENCOUNTER — Encounter: Payer: BC Managed Care – PPO | Admitting: Internal Medicine

## 2023-03-24 ENCOUNTER — Encounter: Payer: BC Managed Care – PPO | Admitting: Internal Medicine

## 2023-04-19 ENCOUNTER — Encounter: Payer: Self-pay | Admitting: Internal Medicine

## 2023-04-19 ENCOUNTER — Ambulatory Visit: Payer: BC Managed Care – PPO | Admitting: Internal Medicine

## 2023-04-19 VITALS — BP 102/68 | HR 75 | Temp 97.7°F | Ht 67.0 in | Wt 130.2 lb

## 2023-04-19 DIAGNOSIS — Z0000 Encounter for general adult medical examination without abnormal findings: Secondary | ICD-10-CM | POA: Diagnosis not present

## 2023-04-19 DIAGNOSIS — Z23 Encounter for immunization: Secondary | ICD-10-CM | POA: Diagnosis not present

## 2023-04-19 DIAGNOSIS — M8588 Other specified disorders of bone density and structure, other site: Secondary | ICD-10-CM

## 2023-04-19 LAB — LIPID PANEL
Cholesterol: 216 mg/dL — ABNORMAL HIGH (ref 0–200)
HDL: 88.5 mg/dL (ref >39.00)
LDL Cholesterol: 111 mg/dL — ABNORMAL HIGH (ref 0–99)
NonHDL: 127.54
Total CHOL/HDL Ratio: 2
Triglycerides: 81 mg/dL (ref 0.0–149.0)
VLDL: 16.2 mg/dL (ref 0.0–40.0)

## 2023-04-19 LAB — COMPREHENSIVE METABOLIC PANEL
ALT: 22 U/L (ref 0–35)
AST: 23 U/L (ref 0–37)
Albumin: 5 g/dL (ref 3.5–5.2)
Alkaline Phosphatase: 69 U/L (ref 39–117)
BUN: 22 mg/dL (ref 6–23)
CO2: 29 meq/L (ref 19–32)
Calcium: 9.9 mg/dL (ref 8.4–10.5)
Chloride: 102 meq/L (ref 96–112)
Creatinine, Ser: 0.71 mg/dL (ref 0.40–1.20)
GFR: 94.31 mL/min (ref >60.00)
Glucose, Bld: 111 mg/dL — ABNORMAL HIGH (ref 70–99)
Potassium: 3.9 meq/L (ref 3.5–5.1)
Sodium: 142 meq/L (ref 135–145)
Total Bilirubin: 0.5 mg/dL (ref 0.2–1.2)
Total Protein: 7.3 g/dL (ref 6.0–8.3)

## 2023-04-19 LAB — CBC WITH DIFFERENTIAL/PLATELET
Basophils Absolute: 0 10*3/uL (ref 0.0–0.1)
Basophils Relative: 0.5 % (ref 0.0–3.0)
Eosinophils Absolute: 0.1 10*3/uL (ref 0.0–0.7)
Eosinophils Relative: 0.9 % (ref 0.0–5.0)
HCT: 42.5 % (ref 36.0–46.0)
Hemoglobin: 14.1 g/dL (ref 12.0–15.0)
Lymphocytes Relative: 22 % (ref 12.0–46.0)
Lymphs Abs: 1.4 10*3/uL (ref 0.7–4.0)
MCHC: 33.2 g/dL (ref 30.0–36.0)
MCV: 94 fL (ref 78.0–100.0)
Monocytes Absolute: 0.3 10*3/uL (ref 0.1–1.0)
Monocytes Relative: 5.3 % (ref 3.0–12.0)
Neutro Abs: 4.4 10*3/uL (ref 1.4–7.7)
Neutrophils Relative %: 71.3 % (ref 43.0–77.0)
Platelets: 218 10*3/uL (ref 150.0–400.0)
RBC: 4.52 Mil/uL (ref 3.87–5.11)
RDW: 13 % (ref 11.5–15.5)
WBC: 6.2 10*3/uL (ref 4.0–10.5)

## 2023-04-19 LAB — TSH: TSH: 2.33 u[IU]/mL (ref 0.35–5.50)

## 2023-04-19 NOTE — Assessment & Plan Note (Signed)
 Here for CPX - Td 11-2016 -Flu shot today - Vaccines I recommend: Shingrix, COVID booster -CCS: cscope no polyps 04-2016, 10 years -Female care per Dr Nikki; MMG 01/2023 -[facility] -Labs:CMP FLP CBC TSH -Diet and exercise: Has a healthy lifestyle --POA discussed

## 2023-04-19 NOTE — Assessment & Plan Note (Signed)
 Here for CPX Osteopenia: 02/2022: T score (-) 2.2 . Risk of major osteoporotic fracture 7.8%, hip fracture 1.1%. Recommend calcium, vitamin D, recheck a DEXA at the 2-year mark. RTC 1 yea

## 2023-04-19 NOTE — Progress Notes (Signed)
 Subjective:    Patient ID: Vanessa Schneider, female    DOB: [birth year], 58 y.o.   MRN: [id number]  DOS:  04/19/2023 Type of visit - description: CPX  Here for CPX Since the last office visit is doing well.  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   Right Outer Thigh - Moderate to Severe tx  wider shave   Atypical nevus 07/11/2007   Right Outer Thigh Sup - Severe tx-exc   Atypical nevus 07/11/2007   Right Outer Thigh Inf - Moderate to Marked tx widershave   Atypical nevus 01/23/2013   Lower Mid Back - Moderate to Severe   Atypical nevus 03/17/2014   Right Post Thigh - Mild   Atypical nevus 10/17/2016   Right Buttock - Mild   Basal cell carcinoma 06/26/2017   left shoulder tx  cx3 48fu   BCC (basal cell carcinoma of skin) 09/25/2018   mid chest tx cx4 29fu   Colon polyps    Contraception    husband's vasectomy   Elevated serum creatinine 2017   Family history of breast cancer    mother (19) and cousin (31)   Family history of colon cancer    Fibroid 02/08/2016   2 cm    Kidney stones 06/2008   spon passed    Past Surgical History:  Procedure Laterality Date   ABLATION  11/24/2008   Novasure   CESAREAN SECTION  06/10/2003   ENDOMETRIAL BIOPSY  10/30/2008   benign   HEMORRHOID SURGERY  04/11/2010   KNEE SURGERY Left 06/1992 and 08/1992   ACL tore bone and second surgery was to remove scar tissue. 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)    Employer: [employer]  Tobacco Use   Smoking status: Never   Smokeless tobacco: Never  Vaping Use   Vaping status: Never Used  Substance and Sexual Activity    Alcohol use: Yes    Alcohol/week: 2.0 - 3.0 standard drinks of alcohol    Types: 2 - 3 Standard drinks or equivalent per week    Comment: 2-3 glasses of wine a week   Drug use: No   Sexual activity: Yes    Partners: Male    Birth control/protection: Other-see comments    Comment: vasectomy/novasure ablation 11/2008  Other Topics Concern   Not on file  Social History Narrative   Household- pt , husband    daughter in college   Social Drivers of Health   Financial Resource Strain: Not on file  Food Insecurity: Not on file  Transportation Needs: Not on file  Physical Activity: Not on file  Stress: Not on file  Social Connections: Unknown (08/24/2021)   Received from [facility], [facility]   Social Network    Social Network: Not on file  Intimate Partner Violence: Unknown (07/16/2021)   Received from [facility], [facility]   HITS    Physically Hurt: Not on file    Insult or Talk Down To: Not on file    Threaten Physical Harm: Not  on file    Scream or Curse: Not on file     Current Outpatient Medications  Medication Instructions   Vitamin D3 2,000 Units, Oral, Daily       Objective:   Physical Exam BP 102/68 (BP Location: Left Arm, Patient Position: Sitting, Cuff Size: Normal)   Pulse 75   Temp 97.7 F (36.5 C) (Oral)   Ht 5' 7 (1.702 m)   Wt 130 lb 3.2 oz (59.1 kg)   SpO2 97%   BMI 20.39 kg/m  General: Well developed, NAD, BMI noted Neck: No  thyromegaly  HEENT:  Normocephalic . Face symmetric, atraumatic Lungs:  CTA B Normal respiratory effort, no intercostal retractions, no accessory muscle use. Heart: RRR,  no murmur.  Abdomen:  Not distended, soft, non-tender. No rebound or rigidity.   Lower extremities: no pretibial edema bilaterally  Skin: Exposed areas without rash. Not pale. Not jaundice Neurologic:  alert & oriented X3.  Speech normal, gait appropriate for age and unassisted Strength symmetric and appropriate for age.   Psych: Cognition and judgment appear intact.  Cooperative with normal attention span and concentration.  Behavior appropriate. No anxious or depressed appearing.     Assessment    Problem list: Kidney stones, 2010 Menopause   BC: s/p ablation d/t DUB  [facility] Sees dermatology Osteopenia  PLAN  Here for CPX - Td 11-2016 -Flu shot today - Vaccines I recommend: Shingrix, COVID booster -CCS: cscope no polyps 04-2016, 10 years -Female care per Dr Nikki; MMG 01/2023 -KPN -Labs:CMP FLP CBC TSH -Diet and exercise: Has a healthy lifestyle --POA discussed Osteopenia: 02/2022: T score (-) 2.2 . Risk of major osteoporotic fracture 7.8%, hip fracture 1.1%. Recommend calcium, vitamin D , recheck a DEXA at the 2-year mark. RTC 1 year

## 2023-04-19 NOTE — Patient Instructions (Signed)
 Take vitamin D  2000 units daily  GO TO THE LAB : Get the blood work     Next visit with me in 1 year for a physical exam    Please schedule it at the front desk       Health Care Power of attorney ,  Living will (Advance care planning documents)  If you already have a living will or healthcare power of attorney, is recommended you bring the copy to be scanned in your chart.   The document will be available to all the doctors you see in the system.  Advance care planning is a process that supports adults in  understanding and sharing their preferences regarding future medical care.  The patient's preferences are recorded in documents called Advance Directives and the can be modified at any time while the patient is in full mental capacity.   If you don't have one, please consider create one.      More information at: [email or web address]

## 2023-04-20 ENCOUNTER — Ambulatory Visit (INDEPENDENT_AMBULATORY_CARE_PROVIDER_SITE_OTHER): Payer: BC Managed Care – PPO

## 2023-04-20 ENCOUNTER — Other Ambulatory Visit: Payer: Self-pay

## 2023-04-20 ENCOUNTER — Ambulatory Visit: Payer: BC Managed Care – PPO

## 2023-04-20 DIAGNOSIS — R739 Hyperglycemia, unspecified: Secondary | ICD-10-CM | POA: Diagnosis not present

## 2023-04-20 LAB — HEMOGLOBIN A1C: Hgb A1c MFr Bld: 5.6 % (ref 4.6–6.5)

## 2023-12-14 ENCOUNTER — Other Ambulatory Visit: Payer: Self-pay | Admitting: Internal Medicine

## 2023-12-14 DIAGNOSIS — Z1231 Encounter for screening mammogram for malignant neoplasm of breast: Secondary | ICD-10-CM

## 2023-12-19 ENCOUNTER — Encounter

## 2023-12-21 ENCOUNTER — Other Ambulatory Visit: Payer: Self-pay | Admitting: Internal Medicine

## 2023-12-21 DIAGNOSIS — Z1231 Encounter for screening mammogram for malignant neoplasm of breast: Secondary | ICD-10-CM

## 2024-01-16 ENCOUNTER — Ambulatory Visit
Admission: RE | Admit: 2024-01-16 | Discharge: 2024-01-16 | Disposition: A | Discharge status: Home / Self Care | Source: Ambulatory Visit

## 2024-01-16 DIAGNOSIS — Z1231 Encounter for screening mammogram for malignant neoplasm of breast: Secondary | ICD-10-CM

## 2024-04-19 ENCOUNTER — Encounter: Payer: Self-pay | Admitting: Internal Medicine

## 2024-04-19 ENCOUNTER — Ambulatory Visit: Payer: BC Managed Care – PPO | Admitting: Internal Medicine

## 2024-04-19 VITALS — BP 112/72 | HR 68 | Temp 98.1°F | Resp 16 | Ht 67.0 in | Wt 129.5 lb

## 2024-04-19 DIAGNOSIS — Z23 Encounter for immunization: Secondary | ICD-10-CM | POA: Diagnosis not present

## 2024-04-19 DIAGNOSIS — Z0000 Encounter for general adult medical examination without abnormal findings: Secondary | ICD-10-CM

## 2024-04-19 DIAGNOSIS — Z780 Asymptomatic menopausal state: Secondary | ICD-10-CM

## 2024-04-19 DIAGNOSIS — R739 Hyperglycemia, unspecified: Secondary | ICD-10-CM | POA: Diagnosis not present

## 2024-04-19 NOTE — Patient Instructions (Signed)
 Please read your instructions carefully.   GO TO THE LAB :  Get the blood work    Go to the front desk for the checkout Please make an appointment for your next physical in 1 year   STOP BY THE FIRST FLOOR: Schedule a bone density test

## 2024-04-19 NOTE — Progress Notes (Unsigned)
 "  Subjective:    Patient ID: Vanessa Schneider Fell, female    DOB: 1966-02-14, 59 y.o.   MRN: [id number]  DOS:  04/19/2024 CPX  Here for CPX. Feeling well physically but is under a lot of stress, taking care of her father.  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   Right Outer Thigh - Moderate to Severe tx  wider shave   Atypical nevus 07/11/2007   Right Outer Thigh Sup - Severe tx-exc   Atypical nevus 07/11/2007   Right Outer Thigh Inf - Moderate to Marked tx widershave   Atypical nevus 01/23/2013   Lower Mid Back - Moderate to Severe   Atypical nevus 03/17/2014   Right Post Thigh - Mild   Atypical nevus 10/17/2016   Right Buttock - Mild   Basal cell carcinoma 06/26/2017   left shoulder tx  cx3 44fu   BCC (basal cell carcinoma of skin) 09/25/2018   mid chest tx cx4 3fu   Colon polyps    Contraception    husband's vasectomy   Elevated serum creatinine 2017   Family history of breast cancer    mother (17) and cousin (44)   Family history of colon cancer    Fibroid 02/08/2016   2 cm    Kidney stones 06/2008   spon passed    Past Surgical History:  Procedure Laterality Date   ABLATION  11/24/2008   Novasure   CESAREAN SECTION  06/10/2003   ENDOMETRIAL BIOPSY  10/30/2008   benign   HEMORRHOID SURGERY  04/11/2010   KNEE SURGERY Left 06/1992 and 08/1992   ACL tore bone and second surgery was to remove scar tissue. Left knee   TONSILLECTOMY AND ADENOIDECTOMY      Current Outpatient Medications  Medication Instructions   Vitamin D3 2,000 Units, Oral, Daily       Objective:   Physical Exam BP 112/72   Pulse 68   Temp 98.1 F (36.7 C) (Oral)   Resp 16   Ht 5' 7 (1.702 m)   Wt 129 lb 8 oz (58.7 kg)   SpO2 97%   BMI 20.28 kg/m  General: Well developed, NAD, BMI noted Neck: No  thyromegaly  HEENT:  Normocephalic . Face symmetric,  atraumatic Lungs:  CTA B Normal respiratory effort, no intercostal retractions, no accessory muscle use. Heart: RRR,  no murmur.  Abdomen:  Not distended, soft, non-tender. No rebound or rigidity.   Lower extremities: no pretibial edema bilaterally  Skin: Exposed areas without rash. Not pale. Not jaundice Neurologic:  alert & oriented X3.  Speech normal, gait appropriate for age and unassisted Strength symmetric and appropriate for age.  Psych: Cognition and judgment appear intact.  Cooperative with normal attention span and concentration.  Behavior appropriate. No anxious or depressed appearing.     Assessment   Problem list: Kidney stones, 2010 Menopause   BC: s/p ablation d/t DUB  [facility] Sees dermatology Osteopenia  PLAN  Here for CPX - Td 11-2016 -Flu shot today - Vaccines I recommend: Shingrix, COVID booster at her convenience -CCS: cscope no polyps 04-2016, 10 years -Female care per Dr Latisha;  MMG 01/16/2024, [facility] -Labs: CMP FLP CBC A1c -Diet and exercise: Has not been able to exercise much lately.  other issues Stress: Taking care of her 33 year old father who is bedridden.  Listening therapy provided,  fortunately although she is  stressed she is managing the situation well. Osteopenia: 02/2022: T score (-) 2.2 . Risk of major osteoporotic fracture 7.8%, hip fracture 1.1%.  Recommend to start taking vitamin D , will schedule DEXA at her convenience. RTC 1 year   "

## 2024-04-20 ENCOUNTER — Encounter: Payer: Self-pay | Admitting: Internal Medicine

## 2024-04-20 LAB — COMPREHENSIVE METABOLIC PANEL WITH GFR
AG Ratio: 2.3 (calc) (ref 1.0–2.5)
ALT: 28 U/L (ref 6–29)
AST: 21 U/L (ref 10–35)
Albumin: 4.9 g/dL (ref 3.6–5.1)
Alkaline phosphatase (APISO): 56 U/L (ref 37–153)
BUN: 17 mg/dL (ref 7–25)
CO2: 30 mmol/L (ref 20–32)
Calcium: 9.6 mg/dL (ref 8.6–10.4)
Chloride: 104 mmol/L (ref 98–110)
Creat: 0.72 mg/dL (ref 0.50–1.03)
Globulin: 2.1 g/dL (ref 1.9–3.7)
Glucose, Bld: 106 mg/dL — ABNORMAL HIGH (ref 65–99)
Potassium: 3.7 mmol/L (ref 3.5–5.3)
Sodium: 143 mmol/L (ref 135–146)
Total Bilirubin: 0.5 mg/dL (ref 0.2–1.2)
Total Protein: 7 g/dL (ref 6.1–8.1)
eGFR: 97 mL/min/1.73m2

## 2024-04-20 LAB — CBC WITH DIFFERENTIAL/PLATELET
Absolute Lymphocytes: 1431 {cells}/uL (ref 850–3900)
Absolute Monocytes: 388 {cells}/uL (ref 200–950)
Basophils Absolute: 29 {cells}/uL (ref 0–200)
Basophils Relative: 0.5 %
Eosinophils Absolute: 51 {cells}/uL (ref 15–500)
Eosinophils Relative: 0.9 %
HCT: 43.2 % (ref 35.9–46.0)
Hemoglobin: 14 g/dL (ref 11.7–15.5)
MCH: 30.5 pg (ref 27.0–33.0)
MCHC: 32.4 g/dL (ref 31.6–35.4)
MCV: 94.1 fL (ref 81.4–101.7)
MPV: 10.2 fL (ref 7.5–12.5)
Monocytes Relative: 6.8 %
Neutro Abs: 3802 {cells}/uL (ref 1500–7800)
Neutrophils Relative %: 66.7 %
Platelets: 203 Thousand/uL (ref 140–400)
RBC: 4.59 Million/uL (ref 3.80–5.10)
RDW: 12.2 % (ref 11.0–15.0)
Total Lymphocyte: 25.1 %
WBC: 5.7 Thousand/uL (ref 3.8–10.8)

## 2024-04-20 LAB — LIPID PANEL
Cholesterol: 223 mg/dL — ABNORMAL HIGH
HDL: 89 mg/dL
LDL Cholesterol (Calc): 120 mg/dL — ABNORMAL HIGH
Non-HDL Cholesterol (Calc): 134 mg/dL — ABNORMAL HIGH
Total CHOL/HDL Ratio: 2.5 (calc)
Triglycerides: 57 mg/dL

## 2024-04-20 LAB — HEMOGLOBIN A1C
Hgb A1c MFr Bld: 5.5 %
Mean Plasma Glucose: 111 mg/dL
eAG (mmol/L): 6.2 mmol/L

## 2024-04-20 NOTE — Assessment & Plan Note (Signed)
 Here for CPX - Td 11-2016 -Flu shot today - Vaccines I recommend: Shingrix, COVID booster at her convenience -CCS: cscope no polyps 04-2016, 10 years -Female care per Dr Latisha;  MMG 01/16/2024, [facility] -Labs: CMP FLP CBC A1c -Diet and exercise: Has not been able to exercise much lately.

## 2024-04-20 NOTE — Assessment & Plan Note (Signed)
 Here for CPX   other issues Stress: Taking care of her 59 year old father who is bedridden.  Listening therapy provided, fortunately although she is  stressed she is managing the situation well. Osteopenia: 02/2022: T score (-) 2.2 . Risk of major osteoporotic fracture 7.8%, hip fracture 1.1%.  Recommend to start taking vitamin D , will schedule DEXA at her convenience. RTC 1 year

## 2024-04-21 ENCOUNTER — Ambulatory Visit: Payer: Self-pay | Admitting: Internal Medicine

## 2024-05-14 ENCOUNTER — Ambulatory Visit (HOSPITAL_BASED_OUTPATIENT_CLINIC_OR_DEPARTMENT_OTHER)
Admission: RE | Admit: 2024-05-14 | Discharge: 2024-05-14 | Disposition: A | Source: Ambulatory Visit | Attending: Internal Medicine | Admitting: Internal Medicine

## 2024-05-17 MED ORDER — ALENDRONATE SODIUM 70 MG PO TABS: 70.0000 mg | ORAL_TABLET | ORAL | 3 refills | Status: AC

## 2024-05-17 NOTE — Telephone Encounter (Signed)
 Rx sent.

## 2024-05-17 NOTE — Telephone Encounter (Signed)
 Copied from CRM #[id number]. Topic: Clinical - Lab/Test Results >> May 17, 2024 10:38 AM Vanessa Schneider wrote: Reason for CRM: Pt called in about lab results. Pt has been advised of results and would also like to start on medication Fosamax  70mg .

## 2025-04-25 ENCOUNTER — Encounter: Admitting: Internal Medicine
